# Patient Record
Sex: Female | Born: 1985 | Race: Black or African American | Hispanic: No | Marital: Married | Smoking: Current every day smoker
Health system: Southern US, Community
[De-identification: ages and names within clinical notes are randomized; demographics above are authoritative.]

---

## 2021-01-13 ENCOUNTER — Encounter: Payer: Self-pay | Admitting: Emergency Medicine

## 2021-01-13 ENCOUNTER — Other Ambulatory Visit: Payer: Self-pay

## 2021-01-13 ENCOUNTER — Emergency Department
Admission: EM | Admit: 2021-01-13 | Discharge: 2021-01-14 | Disposition: A | Discharge status: Home / Self Care | Payer: Self-pay | Attending: Emergency Medicine | Admitting: Emergency Medicine

## 2021-01-13 DIAGNOSIS — Z20822 Contact with and (suspected) exposure to covid-19: Secondary | ICD-10-CM | POA: Insufficient documentation

## 2021-01-13 DIAGNOSIS — F23 Brief psychotic disorder: Secondary | ICD-10-CM

## 2021-01-13 DIAGNOSIS — F1210 Cannabis abuse, uncomplicated: Secondary | ICD-10-CM

## 2021-01-13 DIAGNOSIS — F99 Mental disorder, not otherwise specified: Secondary | ICD-10-CM

## 2021-01-13 DIAGNOSIS — F17200 Nicotine dependence, unspecified, uncomplicated: Secondary | ICD-10-CM | POA: Insufficient documentation

## 2021-01-13 LAB — RESP PANEL BY RT-PCR (FLU A&B, COVID) ARPGX2
Influenza A by PCR: NEGATIVE
Influenza B by PCR: NEGATIVE
SARS Coronavirus 2 by RT PCR: NEGATIVE

## 2021-01-13 LAB — CBC
HCT: 35.3 % — ABNORMAL LOW (ref 36.0–46.0)
Hemoglobin: 11.9 g/dL — ABNORMAL LOW (ref 12.0–15.0)
MCH: 30.9 pg (ref 26.0–34.0)
MCHC: 33.7 g/dL (ref 30.0–36.0)
MCV: 91.7 fL (ref 80.0–100.0)
Platelets: 241 10*3/uL (ref 150–400)
RBC: 3.85 MIL/uL — ABNORMAL LOW (ref 3.87–5.11)
RDW: 13.7 % (ref 11.5–15.5)
WBC: 8.9 10*3/uL (ref 4.0–10.5)
nRBC: 0 % (ref 0.0–0.2)

## 2021-01-13 LAB — COMPREHENSIVE METABOLIC PANEL
ALT: 31 U/L (ref 0–44)
AST: 53 U/L — ABNORMAL HIGH (ref 15–41)
Albumin: 4.3 g/dL (ref 3.5–5.0)
Alkaline Phosphatase: 43 U/L (ref 38–126)
Anion gap: 14 (ref 5–15)
BUN: 21 mg/dL — ABNORMAL HIGH (ref 6–20)
CO2: 18 mmol/L — ABNORMAL LOW (ref 22–32)
Calcium: 9.4 mg/dL (ref 8.9–10.3)
Chloride: 105 mmol/L (ref 98–111)
Creatinine, Ser: 1.13 mg/dL — ABNORMAL HIGH (ref 0.44–1.00)
GFR, Estimated: >60 mL/min (ref >60)
Glucose, Bld: 91 mg/dL (ref 70–99)
Potassium: 3.2 mmol/L — ABNORMAL LOW (ref 3.5–5.1)
Sodium: 137 mmol/L (ref 135–145)
Total Bilirubin: 0.9 mg/dL (ref 0.3–1.2)
Total Protein: 7.9 g/dL (ref 6.5–8.1)

## 2021-01-13 LAB — SALICYLATE LEVEL: Salicylate Lvl: <7 mg/dL — ABNORMAL LOW (ref 7.0–30.0)

## 2021-01-13 LAB — ACETAMINOPHEN LEVEL: Acetaminophen (Tylenol), Serum: <10 ug/mL — ABNORMAL LOW (ref 10–30)

## 2021-01-13 LAB — ETHANOL: Alcohol, Ethyl (B): <10 mg/dL (ref <10)

## 2021-01-13 MED ORDER — LORAZEPAM 1 MG PO TABS
1.0000 mg | ORAL_TABLET | ORAL | Status: AC
  Administered 2021-01-13: 1 mg via ORAL
  Filled 2021-01-13: qty 1

## 2021-01-13 MED ORDER — LORAZEPAM 2 MG/ML IJ SOLN
2.0000 mg | Freq: Once | INTRAMUSCULAR | Status: AC
  Administered 2021-01-13: 2 mg via INTRAMUSCULAR
  Filled 2021-01-13: qty 1

## 2021-01-13 MED ORDER — NICOTINE 21 MG/24HR TD PT24: 21.0000 mg | MEDICATED_PATCH | Freq: Once | TRANSDERMAL | Status: DC

## 2021-01-13 MED ORDER — ZIPRASIDONE MESYLATE 20 MG IM SOLR
20.0000 mg | Freq: Once | INTRAMUSCULAR | Status: AC
  Administered 2021-01-13: 20 mg via INTRAMUSCULAR

## 2021-01-13 NOTE — ED Triage Notes (Signed)
Pt presents via acems with c/o bizarre behavior. Manager of apartment complex reports patient came into office this morning saying bizarre things with flight of ideas. Pt unable to keep single strain of thoughts. Pt denies SI/HI, AH/VH to this RN. Patient states working at [employer] has drove her "literally crazy". Pt with flight of ideas and not making much sense during triage. Pt states "I smoke weed and cigarettes, nothing else". Pt cooperative in dressing out with this RN and Caitlyn, NT. Belongings include: 1 black apron, 2 black shoes, 1 black jacket, 1 green shirt, 1 brown pants

## 2021-01-13 NOTE — ED Notes (Signed)
When this RN attempted to move patient to BMU, patient became irrate and began barking towards IT consultant in quad. Pt would not cooperate with move and was refusing to move. Pt unable to be easily directed at this time. Pt not violent towards this RN, only Psychologist, educational. IM medications ordered and administered as ordered. Pt took IM medications voluntarily by this RN and Joellen Jersey, Therapist, sports. No manual hold required. Pt calm and in room after administration. Pt given meal tray and sprite to drink at this time. BMU RN Joy informed that patient would not be moving at this time due to incident. Joy RN voiced understanding and stated RN could call after shift change if patient behavior improved.

## 2021-01-13 NOTE — ED Notes (Signed)
Patient is resting comfortably in bed with eyes closed

## 2021-01-13 NOTE — ED Notes (Signed)
Pt respirations remain even and unlabored at this time.
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
## 2021-01-13 NOTE — ED Notes (Signed)
IVC, pending BMU admit

## 2021-01-13 NOTE — BH Assessment (Signed)
Patient is to be admitted to [facility] by Dr. Weber Cooks.  Attending Physician will be Dr. Domingo Cocking.   Patient has been assigned to room 306, by [facility] Charge Nurse [place]  ER staff is aware of the admission:  Nithcia, ER Secretary    Dr. Archie Balboa, ER MD   Deneise Lever, Patient's Nurse   Carver Fila., Patient Access.

## 2021-01-13 NOTE — ED Notes (Signed)
Pt refusing to provide urine sample at this time. Pt pouring water in urine sample cup. Pt still with loose train of thought. Pt very paranoid stating to this RN "Why you standing like you gonna body slam me on this bed". Pt also stating at this time "I didn't know they were putting crack in newports now". Pt unable to be redirected, but patient not aggressive or violent. Pt returning to bed to lay down at this time.
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
## 2021-01-13 NOTE — ED Provider Notes (Signed)
[facility] Emergency Department Provider Note   ____________________________________________   I have reviewed the triage vital signs and the nursing notes.   HISTORY  Chief Complaint Psychiatric Evaluation   History limited by and level 5 caveat due to: Altered Mental Status/Psychosis   HPI Melanie Murray is a 35 y.o. female who presents to the emergency department today via EMS after being found to be saying odd things at her apartment complex. The patient herself cannot give a good history as to what happened today. She primarily talks about Public house manager and how she was there for so long she feels like they are always watching her. The patient also states she feels she needs to detox from cigarettes and marijuana.    Records reviewed. Per medical record review no pertinent history.   History reviewed. No pertinent past medical history.  There are no problems to display for this patient.   History reviewed. No pertinent surgical history.  Prior to Admission medications   Not on File    Allergies Patient has no known allergies.  History reviewed. No pertinent family history.  Social History Social History   Tobacco Use  . Smoking status: Current Every Day Smoker  . Smokeless tobacco: Never Used    Review of Systems Unable to obtain reliable ROS secondary to AMS.  ____________________________________________   PHYSICAL EXAM:  VITAL SIGNS: ED Triage Vitals  Enc Vitals Group     BP 01/13/21 1045 119/69     Pulse Rate 01/13/21 1045 96     Resp 01/13/21 1045 18     Temp 01/13/21 1045 98.5 F (36.9 C)     Temp Source 01/13/21 1045 Oral     SpO2 01/13/21 1045 99 %     Weight 01/13/21 1043 140 lb (63.5 kg)     Height 01/13/21 1043 5\' 5"  (1.651 m)     Head Circumference --      Peak Flow --      Pain Score 01/13/21 1043 0   Constitutional: Alert and oriented.  Eyes: Conjunctivae are normal.  ENT      Head: Normocephalic and  atraumatic.      Nose: No congestion/rhinnorhea.      Mouth/Throat: Mucous membranes are moist.      Neck: No stridor. Hematological/Lymphatic/Immunilogical: No cervical lymphadenopathy. Cardiovascular: Normal rate, regular rhythm.  No murmurs, rubs, or gallops.  Respiratory: Normal respiratory effort without tachypnea nor retractions. Breath sounds are clear and equal bilaterally. No wheezes/rales/rhonchi. Gastrointestinal: Soft and non tender. No rebound. No guarding.  Genitourinary: Deferred Musculoskeletal: Normal range of motion in all extremities. No lower extremity edema. Neurologic:  Pressured speech. No gross focal neurologic deficits are appreciated.  Skin:  Skin is warm, dry and intact. No rash noted. Psychiatric: Paranoia. Delusions. Pressured speech.   ____________________________________________    LABS (pertinent positives/negatives)  Salicylate, Tylenol, Ethanol below threshold CBC wbc 8.9, hgb 11.9, plt 241 CMP na 137, k 3.2, glu 91, cr 1.13  ____________________________________________   EKG  None  ____________________________________________    RADIOLOGY  None  ____________________________________________   PROCEDURES  Procedures  ____________________________________________   INITIAL IMPRESSION / ASSESSMENT AND PLAN / ED COURSE  Pertinent labs & imaging results that were available during my care of the patient were reviewed by me and considered in my medical decision making (see chart for details).   Patient presented to the emergency department today because of concern for abnormal behavior. On exam patient appears psychotic. Will have psychiatry evaluate.  The patient has been placed in psychiatric observation due to the need to provide a safe environment for the patient while obtaining psychiatric consultation and evaluation, as well as ongoing medical and medication management to treat the patient's condition.  The patient has not been  placed under full IVC at this time.   ____________________________________________   FINAL CLINICAL IMPRESSION(S) / ED DIAGNOSES  Psychosis  Note: This dictation was prepared with Dragon dictation. Any transcriptional errors that result from this process are unintentional     Nance Pear, MD 01/13/21 1347

## 2021-01-14 ENCOUNTER — Encounter: Payer: Self-pay | Admitting: Psychiatry

## 2021-01-14 ENCOUNTER — Other Ambulatory Visit: Payer: Self-pay

## 2021-01-14 ENCOUNTER — Inpatient Hospital Stay
Admission: RE | Admit: 2021-01-14 | Discharge: 2021-01-18 | DRG: 885 | Disposition: A | Discharge status: Home / Self Care | Payer: 59 | Source: Intra-hospital | Attending: Behavioral Health | Admitting: Behavioral Health

## 2021-01-14 DIAGNOSIS — F1210 Cannabis abuse, uncomplicated: Secondary | ICD-10-CM | POA: Diagnosis present

## 2021-01-14 DIAGNOSIS — G4700 Insomnia, unspecified: Secondary | ICD-10-CM | POA: Diagnosis present

## 2021-01-14 DIAGNOSIS — F23 Brief psychotic disorder: Principal | ICD-10-CM | POA: Diagnosis present

## 2021-01-14 DIAGNOSIS — F17210 Nicotine dependence, cigarettes, uncomplicated: Secondary | ICD-10-CM | POA: Diagnosis present

## 2021-01-14 DIAGNOSIS — F29 Unspecified psychosis not due to a substance or known physiological condition: Secondary | ICD-10-CM

## 2021-01-14 DIAGNOSIS — Z20822 Contact with and (suspected) exposure to covid-19: Secondary | ICD-10-CM | POA: Diagnosis present

## 2021-01-14 MED ORDER — RISPERIDONE 1 MG PO TBDP: 2.0000 mg | ORAL_TABLET | Freq: Every day | ORAL | Status: DC

## 2021-01-14 MED ORDER — OLANZAPINE 5 MG PO TBDP
10.0000 mg | ORAL_TABLET | Freq: Every day | ORAL | Status: DC
  Filled 2021-01-14: qty 2

## 2021-01-14 MED ORDER — LORAZEPAM 1 MG PO TABS
1.0000 mg | ORAL_TABLET | Freq: Four times a day (QID) | ORAL | Status: DC | PRN
  Administered 2021-01-14 – 2021-01-15 (×2): 1 mg via ORAL
  Filled 2021-01-14 (×2): qty 1

## 2021-01-14 MED ORDER — ACETAMINOPHEN 325 MG PO TABS
650.0000 mg | ORAL_TABLET | Freq: Four times a day (QID) | ORAL | Status: DC | PRN
  Administered 2021-01-17 – 2021-01-18 (×2): 650 mg via ORAL
  Filled 2021-01-14 (×2): qty 2

## 2021-01-14 MED ORDER — LORAZEPAM 2 MG/ML IJ SOLN
2.0000 mg | Freq: Once | INTRAMUSCULAR | Status: AC
  Administered 2021-01-14: 2 mg via INTRAMUSCULAR
  Filled 2021-01-14: qty 1

## 2021-01-14 MED ORDER — MAGNESIUM HYDROXIDE 400 MG/5ML PO SUSP: 30.0000 mL | Freq: Every day | ORAL | Status: DC | PRN

## 2021-01-14 MED ORDER — ZIPRASIDONE MESYLATE 20 MG IM SOLR
20.0000 mg | Freq: Four times a day (QID) | INTRAMUSCULAR | Status: DC | PRN
  Administered 2021-01-15: 20 mg via INTRAMUSCULAR
  Filled 2021-01-14: qty 20

## 2021-01-14 MED ORDER — ALUM & MAG HYDROXIDE-SIMETH 200-200-20 MG/5ML PO SUSP: 30.0000 mL | ORAL | Status: DC | PRN

## 2021-01-14 MED ORDER — NICOTINE 21 MG/24HR TD PT24
21.0000 mg | MEDICATED_PATCH | Freq: Once | TRANSDERMAL | Status: AC
  Administered 2021-01-14: 21 mg via TRANSDERMAL
  Filled 2021-01-14: qty 1

## 2021-01-14 NOTE — ED Notes (Signed)
REPORT GIVEN TO RECEIVING NURSE, AWAITING TRANSFER TO [facility] [id number]

## 2021-01-14 NOTE — H&P (Addendum)
Psychiatric Admission Assessment Adult  Patient Identification: Melanie Murray MRN:  [id number] Date of Evaluation:  01/14/2021 Chief Complaint:  Psychosis ([place]) [F29] Principal Diagnosis: Psychosis ([place]) Diagnosis:  Principal Problem:   Psychosis ([place]) Active Problems:   Cannabis abuse  CC "Kristopher Oppenheim did this."  History of Present Illness: 35 year old female who presented to the emergency room for altered mental status and bizarre behavior. On examination today she is restless, pacing, and labile. She has intermittent inappropriate laughter. Her responses to questions are circumstantial, and at time completely unrelated to what I have just asked. Her thoughts are grossly disorganized, and she appears to be responding to internal stimuli. From what I can gather from interview, she feels that she has been overworked at her job at [employer] for the last 4 years. She feels she is giving a lot to make sure her three children (ages 5, 18, and 17) have everything they want. She also felt she was doing a lot for her husband, but he recently left her for another woman. She also notes she spends a lot of money on her sister and family, and mentions purchasing a car for her sister. However, she feels that she is just being used by the people in her life, and she cannot get any help in return. She also requests carrots multiple times through the exam stating they help her with smoking cessation. She endorses poor sleep, poor appetite, fatigue, and anhedonia. She also exhibits some symptoms of mania including pressured speech, labile mood, and psychosis. She denies suicidal ideations, homicidal ideations, visual hallucinations, or auditory hallucinations. However, she does appear internally preoccupied on exam.   I explained that I would be starting Zyprexa tonight to assist with disorganized thoughts, mood, insomnia, and poor appetite. Patient was agreeable with this plan.    Associated  Signs/Symptoms: Depression Symptoms:  depressed mood, insomnia, fatigue, difficulty concentrating, impaired memory, weight loss, Duration of Depression Symptoms: Less than two weeks  (Hypo) Manic Symptoms:  Distractibility, Financial Extravagance, Impulsivity, Labiality of Mood, Anxiety Symptoms:  Excessive Worry, Psychotic Symptoms:  Paranoia, PTSD Symptoms: Negative Total Time spent with patient: 1 hour  Past Psychiatric History: Patient denies any previous psychiatric treatment outpatient or inpatient. No previous medication trials, no previous suicide attempts.   Is the patient at risk to self? Yes.    Has the patient been a risk to self in the past 6 months? No.  Has the patient been a risk to self within the distant past? No.  Is the patient a risk to others? No.  Has the patient been a risk to others in the past 6 months? No.  Has the patient been a risk to others within the distant past? No.   Prior Inpatient Therapy:   Prior Outpatient Therapy:    Alcohol Screening: 1. How often do you have a drink containing alcohol?: 2 to 4 times a month 2. How many drinks containing alcohol do you have on a typical day when you are drinking?: 1 or 2 3. How often do you have six or more drinks on one occasion?: Never AUDIT-C Score: 2 4. How often during the last year have you found that you were not able to stop drinking once you had started?: Never 5. How often during the last year have you failed to do what was normally expected from you because of drinking?: Never 6. How often during the last year have you needed a first drink in the morning to get yourself going after  a heavy drinking session?: Never 7. How often during the last year have you had a feeling of guilt of remorse after drinking?: Never 8. How often during the last year have you been unable to remember what happened the night before because you had been drinking?: Never 9. Have you or someone else been injured as a  result of your drinking?: No 10. Has a relative or friend or a doctor or another health worker been concerned about your drinking or suggested you cut down?: No Alcohol Use Disorder Identification Test Final Score (AUDIT): 2 Substance Abuse History in the last 12 months:  Yes.   Consequences of Substance Abuse: Worsening mental health Previous Psychotropic Medications: No  Psychological Evaluations: Yes  Past Medical History: History reviewed. No pertinent past medical history. History reviewed. No pertinent surgical history. Family History: History reviewed. No pertinent family history. Family Psychiatric  History: States her mother struggles with depression and anxiety, but does not believe she ever got medication for these Tobacco Screening:   Social History:  Social History   Substance and Sexual Activity  Alcohol Use Yes  . Alcohol/week: 1.0 standard drink  . Types: 1 Standard drinks or equivalent per week     Social History   Substance and Sexual Activity  Drug Use Not on file    Additional Social History:                           Allergies:  No Known Allergies Lab Results:  Results for orders placed or performed during the hospital encounter of 01/13/21 (from the past 48 hour(s))  Comprehensive metabolic panel     Status: Abnormal   Collection Time: 01/13/21 10:25 AM  Result Value Ref Range   Sodium 137 135 - 145 mmol/L   Potassium 3.2 (L) 3.5 - 5.1 mmol/L   Chloride 105 98 - 111 mmol/L   CO2 18 (L) 22 - 32 mmol/L   Glucose, Bld 91 70 - 99 mg/dL    Comment: Glucose reference range applies only to samples taken after fasting for at least 8 hours.   BUN 21 (H) 6 - 20 mg/dL   Creatinine, Ser 1.13 (H) 0.44 - 1.00 mg/dL   Calcium 9.4 8.9 - 10.3 mg/dL   Total Protein 7.9 6.5 - 8.1 g/dL   Albumin 4.3 3.5 - 5.0 g/dL   AST 53 (H) 15 - 41 U/L   ALT 31 0 - 44 U/L   Alkaline Phosphatase 43 38 - 126 U/L   Total Bilirubin 0.9 0.3 - 1.2 mg/dL   GFR, Estimated >60  >60 mL/min    Comment: (NOTE) Calculated using the CKD-EPI Creatinine Equation (2021)    Anion gap 14 5 - 15    Comment: Performed at [facility], [address] [place], [place] [address]  Ethanol     Status: None   Collection Time: 01/13/21 10:25 AM  Result Value Ref Range   Alcohol, Ethyl (B) <10 <10 mg/dL    Comment: (NOTE) Lowest detectable limit for serum alcohol is 10 mg/dL.  For medical purposes only. Performed at [facility], [address] [place], [place] [address]   Salicylate level     Status: Abnormal   Collection Time: 01/13/21 10:25 AM  Result Value Ref Range   Salicylate Lvl <1.7 (L) 7.0 - 30.0 mg/dL    Comment: Performed at [facility], [address], [place],  [address]  Acetaminophen level  Status: Abnormal   Collection Time: 01/13/21 10:25 AM  Result Value Ref Range   Acetaminophen (Tylenol), Serum <10 (L) 10 - 30 ug/mL    Comment: (NOTE) Therapeutic concentrations vary significantly. A range of 10-30 ug/mL  may be an effective concentration for many patients. However, some  are best treated at concentrations outside of this range. Acetaminophen concentrations >150 ug/mL at 4 hours after ingestion  and >50 ug/mL at 12 hours after ingestion are often associated with  toxic reactions.  Performed at [facility], [address] [place], [place] [address]   cbc     Status: Abnormal   Collection Time: 01/13/21 10:25 AM  Result Value Ref Range   WBC 8.9 4.0 - 10.5 K/uL   RBC 3.85 (L) 3.87 - 5.11 MIL/uL   Hemoglobin 11.9 (L) 12.0 - 15.0 g/dL   HCT 35.3 (L) 36.0 - 46.0 %   MCV 91.7 80.0 - 100.0 fL   MCH 30.9 26.0 - 34.0 pg   MCHC 33.7 30.0 - 36.0 g/dL   RDW 13.7 11.5 - 15.5 %   Platelets 241 150 - 400 K/uL   nRBC 0.0 0.0 - 0.2 %    Comment: Performed at [facility], [address] [place], [place] [address]  Resp Panel by RT-PCR (Flu A&B, Covid) Nasopharyngeal Swab     Status: None    Collection Time: 01/13/21  1:53 PM   Specimen: Nasopharyngeal Swab; Nasopharyngeal(NP) swabs in vial transport medium  Result Value Ref Range   SARS Coronavirus 2 by RT PCR NEGATIVE NEGATIVE    Comment: (NOTE) SARS-CoV-2 target nucleic acids are NOT DETECTED.  The SARS-CoV-2 RNA is generally detectable in upper respiratory specimens during the acute phase of infection. The lowest concentration of SARS-CoV-2 viral copies this assay can detect is 138 copies/mL. A negative result does not preclude SARS-Cov-2 infection and should not be used as the sole basis for treatment or other patient management decisions. A negative result may occur with  improper specimen collection/handling, submission of specimen other than nasopharyngeal swab, presence of viral mutation(s) within the areas targeted by this assay, and inadequate number of viral copies(<138 copies/mL). A negative result must be combined with clinical observations, patient history, and epidemiological information. The expected result is Negative.  Fact Sheet for Patients:  EntrepreneurPulse.com.au  Fact Sheet for Healthcare Providers:  IncredibleEmployment.be  This test is no t yet approved or cleared by the [place] FDA and  has been authorized for detection and/or diagnosis of SARS-CoV-2 by FDA under an Emergency Use Authorization (EUA). This EUA will remain  in effect (meaning this test can be used) for the duration of the COVID-19 declaration under Section 564(b)(1) of the Act, 21 U.S.C.section 360bbb-3(b)(1), unless the authorization is terminated  or revoked sooner.       Influenza A by PCR NEGATIVE NEGATIVE   Influenza B by PCR NEGATIVE NEGATIVE    Comment: (NOTE) The Xpert Xpress SARS-CoV-2/FLU/RSV plus assay is intended as an aid in the diagnosis of influenza from Nasopharyngeal swab specimens and should not be used as a sole basis for treatment. Nasal washings  and aspirates are unacceptable for Xpert Xpress SARS-CoV-2/FLU/RSV testing.  Fact Sheet for Patients: EntrepreneurPulse.com.au  Fact Sheet for Healthcare Providers: IncredibleEmployment.be  This test is not yet approved or cleared by the [place] FDA and has been authorized for detection and/or diagnosis of SARS-CoV-2 by FDA under an Emergency Use Authorization (EUA). This EUA will remain in effect (meaning this test can  be used) for the duration of the COVID-19 declaration under Section 564(b)(1) of the Act, 21 U.S.C. section 360bbb-3(b)(1), unless the authorization is terminated or revoked.  Performed at [facility], [address] [place], [place] [address]     Blood Alcohol level:  Lab Results  Component Value Date   [facility] <10 35/00/9381    Metabolic Disorder Labs:  No results found for: HGBA1C, MPG No results found for: PROLACTIN No results found for: CHOL, TRIG, HDL, CHOLHDL, VLDL, LDLCALC  Current Medications: Current Facility-Administered Medications  Medication Dose Route Frequency Provider Last Rate Last Admin  . acetaminophen (TYLENOL) tablet 650 mg  650 mg Oral Q6H PRN Clapacs, John T, MD      . alum & mag hydroxide-simeth (MAALOX/MYLANTA) 200-200-20 MG/5ML suspension 30 mL  30 mL Oral Q4H PRN Clapacs, John T, MD      . LORazepam (ATIVAN) tablet 1 mg  1 mg Oral Q6H PRN Clapacs, John T, MD      . magnesium hydroxide (MILK OF MAGNESIA) suspension 30 mL  30 mL Oral Daily PRN Clapacs, John T, MD      . nicotine (NICODERM CQ - dosed in mg/24 hours) patch 21 mg  21 mg Transdermal Once Clapacs, John T, MD      . OLANZapine zydis (ZYPREXA) disintegrating tablet 10 mg  10 mg Oral QHS Salley Scarlet, MD      . ziprasidone (GEODON) injection 20 mg  20 mg Intramuscular Q6H PRN Salley Scarlet, MD       PTA Medications: No medications prior to admission.    Musculoskeletal: Strength & Muscle Tone: within normal  limits Gait & Station: normal Patient leans: N/A            Psychiatric Specialty Exam:  Presentation  General Appearance: Disheveled  Eye Contact:Fair  Speech:Pressured  Speech Volume:Normal  Handedness:Right   Mood and Affect  Mood:Labile  Affect:Labile; Tearful   Thought Process  Thought Processes:Disorganized  Duration of Psychotic Symptoms: Less than six months  Past Diagnosis of Schizophrenia or Psychoactive disorder: No  Descriptions of Associations:Circumstantial  Orientation:Full (Time, Place and Person)  Thought Content:Perseveration; Scattered  Hallucinations:Hallucinations: -- (Patient denies, but appears internally preoccupied)  Ideas of Reference:None  Suicidal Thoughts:Suicidal Thoughts: Yes, Passive  Homicidal Thoughts:Homicidal Thoughts: No   Sensorium  Memory:Immediate Poor; Recent Fair; Remote [place]   Executive Functions  Concentration:Poor  Attention Span:Poor  [place]   Psychomotor Activity  Psychomotor Activity:Psychomotor Activity: Increased   Assets  Assets:Desire for Improvement; Housing; Social Support; Talents/Skills; Vocational/Educational   Sleep  Sleep:Sleep: Poor    Physical Exam: Physical Exam Vitals and nursing note reviewed.  Constitutional:      Appearance: Normal appearance.  HENT:     Head: Normocephalic and atraumatic.     Right Ear: External ear normal.     Left Ear: External ear normal.     Nose: Nose normal.     Mouth/Throat:     Mouth: Mucous membranes are moist.     Pharynx: Oropharynx is clear.  Eyes:     Extraocular Movements: Extraocular movements intact.     Conjunctiva/sclera: Conjunctivae normal.     Pupils: Pupils are equal, round, and reactive to light.  Cardiovascular:     Rate and Rhythm: Normal rate.     Pulses: Normal pulses.  Pulmonary:     Effort: Pulmonary effort is normal.      Breath sounds: Normal breath  sounds.  Abdominal:     General: Abdomen is flat.     Palpations: Abdomen is soft.  Musculoskeletal:        General: No swelling. Normal range of motion.     Cervical back: Normal range of motion and neck supple.  Skin:    General: Skin is warm and dry.  Neurological:     General: No focal deficit present.     Mental Status: She is alert.     Cranial Nerves: No cranial nerve deficit.  Psychiatric:        Attention and Perception: She is inattentive.        Mood and Affect: Affect is labile and tearful.        Speech: Speech is rapid and pressured.        Behavior: Behavior is agitated.        Thought Content: Thought content is paranoid.        Cognition and Memory: Cognition is impaired. Memory is impaired.        Judgment: Judgment is impulsive.    Review of Systems  Constitutional: Positive for malaise/fatigue and weight loss.  HENT: Negative.   Eyes: Negative.   Respiratory: Negative.   Cardiovascular: Negative.   Gastrointestinal: Negative.   Genitourinary: Negative.   Musculoskeletal: Positive for back pain and myalgias.  Skin: Negative.   Neurological: Negative.   Endo/Heme/Allergies: Negative.   Psychiatric/Behavioral: Positive for substance abuse. The patient is nervous/anxious and has insomnia.    Blood pressure (!) 121/93, pulse 80, temperature 98 F (36.7 C), temperature source Oral, resp. rate 16, height 5\' 5"  (1.651 m), weight 73.9 kg, SpO2 99 %. Body mass index is 27.12 kg/m.  Treatment Plan Summary: Daily contact with patient to assess and evaluate symptoms and progress in treatment and Medication management 35 year old female presenting with altered mental status and psychosis. Differential includes bipolar disorder with psychotic features, mixed episode of depression with psychotic features, brief psychotic disorder, or substance induced psychosis. Will start Zyprexa 10 mg QHS.   Observation Level/Precautions:  15 minute  checks  Laboratory:  Lipid panel, hemoglobin A1c, TSH, RPR, UDS  Psychotherapy:    Medications:    Consultations:    Discharge Concerns:    Estimated LOS:  Other:     Physician Treatment Plan for Primary Diagnosis: Psychosis ([place]) Long Term Goal(s): Improvement in symptoms so as ready for discharge  Short Term Goals: Ability to identify changes in lifestyle to reduce recurrence of condition will improve, Ability to verbalize feelings will improve, Ability to disclose and discuss suicidal ideas, Ability to demonstrate self-control will improve, Ability to identify and develop effective coping behaviors will improve, Ability to maintain clinical measurements within normal limits will improve, Compliance with prescribed medications will improve and Ability to identify triggers associated with substance abuse/mental health issues will improve  Physician Treatment Plan for Secondary Diagnosis: Principal Problem:   Psychosis ([place]) Active Problems:   Cannabis abuse  Long Term Goal(s): Improvement in symptoms so as ready for discharge  Short Term Goals: Ability to identify changes in lifestyle to reduce recurrence of condition will improve, Ability to verbalize feelings will improve, Ability to disclose and discuss suicidal ideas, Ability to demonstrate self-control will improve, Ability to identify and develop effective coping behaviors will improve, Ability to maintain clinical measurements within normal limits will improve, Compliance with prescribed medications will improve and Ability to identify triggers associated with substance abuse/mental health issues will improve  I certify that inpatient services furnished can  reasonably be expected to improve the patient's condition.    Salley Scarlet, MD 4/15/20223:48 PM

## 2021-01-14 NOTE — ED Notes (Addendum)
Pt's husband in pt room for 15 minute visit. Visit being monitored by this tech.

## 2021-01-14 NOTE — ED Provider Notes (Signed)
Emergency Medicine Observation Re-evaluation Note  Korena Nass is a 35 y.o. female, seen on rounds today.  Pt initially presented to the ED for complaints of Psychiatric Evaluation Currently, the patient is agitated, pacing around her room, slapping her lateral thighs repeatedly.  Physical Exam  BP 107/63 (BP Location: Left Arm)   Pulse 71   Temp 98.7 F (37.1 C) (Oral)   Resp 16   Ht 5\' 5"  (1.651 m)   Wt 63.5 kg   SpO2 100%   BMI 23.30 kg/m  Physical Exam Gen: No acute distress  Resp: Normal rise and fall of chest Neuro: Moving all four extremities Psych: Resting currently, calm and cooperative when awake    ED Course / MDM  EKG:   I have reviewed the labs performed to date as well as medications administered while in observation.  Recent changes in the last 24 hours include increasing agitation overnight requiring IM Ativan.  Plan  Current plan is for psychiatric inpatient treatment. Patient is under full IVC at this time.   Zale Marcotte, Delice Bison, DO 01/14/21 [phone]

## 2021-01-14 NOTE — ED Notes (Signed)
Patient administered IM Ativan 2mg  for anxiety. She also stated she had pain in her right upper arm. Patient was still experiencing anxiety. She spoke about her husband and stated that he once left the marriage which caused a lot of depression. She again asked about her husband, children and mother. Patient tolerated medication well. No agitation noted or observed. No redirection needed. Request for ice and a blanket. Patient has been in and out of sleep. Will continue to monitor.

## 2021-01-14 NOTE — Progress Notes (Signed)
Patient is pleasant and cooperative with assessment. However, her thoughts are disorganized and she is responding to internal stimuli. Speech is tangential and she sometimes appears to be speaking to herself. Patient denies SI, HI, and AVH. Patient is stressed about her job at [employer] and needs to support her three children. She also feels like she needs to work on her anger and states she can have outbursts. Patient is also stressed because she is having problems with her husband.   While on the unit, patient is restless and paces the unit. Her mood is labile and she is irritable at times. Patient endorsed anxiety and was visibly shaking. Patient was given PRN Ativan.  Patient remains safe on the unit at this time and q15 min safety checks are maintained.

## 2021-01-14 NOTE — ED Notes (Signed)
Patient received phone call from Husband.

## 2021-01-14 NOTE — Tx Team (Signed)
Initial Treatment Plan 01/14/2021 5:50 PM [name] DHR:[id number]   PATIENT STRESSORS: Marital or family conflict Occupational concerns   PATIENT STRENGTHS: Capable of independent living   PATIENT IDENTIFIED PROBLEMS: Being overworked at her job  Marital conflict with husband                   DISCHARGE CRITERIA:  Improved stabilization in mood, thinking, and/or behavior  PRELIMINARY DISCHARGE PLAN: Return to previous living arrangement  PATIENT/FAMILY INVOLVEMENT: This treatment plan has been presented to and reviewed with the patient, Melanie Murray.  The patient has been given the opportunity to ask questions and make suggestions.  Sunday Spillers, RN 01/14/2021, 5:50 PM

## 2021-01-14 NOTE — BHH Suicide Risk Assessment (Signed)
[facility] Admission Suicide Risk Assessment   Nursing information obtained from:  Patient Demographic factors:  Divorced or widowed Current Mental Status:  NA Loss Factors:  Loss of significant relationship Historical Factors:  NA Risk Reduction Factors:  Employed  Total Time spent with patient: 1 hour Principal Problem: Psychosis ([place]) Diagnosis:  Principal Problem:   Psychosis ([place]) Active Problems:   Cannabis abuse  Subjective Data: 35 year old female who presented to the emergency room for altered mental status and bizarre behavior. On examination today she is restless, pacing, and labile. She has intermittent inappropriate laughter. Her responses to questions are circumstantial, and at time completely unrelated to what I have just asked. Her thoughts are grossly disorganized, and she appears to be responding to internal stimuli. From what I can gather from interview, she feels that she has been overworked at her job at [employer] for the last 4 years. She feels she is giving a lot to make sure her three children (ages 61, 45, and 58) have everything they want. She also felt she was doing a lot for her husband, but he recently left her for another woman. She also notes she spends a lot of money on her sister and family, and mentions purchasing a car for her sister. However, she feels that she is just being used by the people in her life, and she cannot get any help in return. She also requests carrots multiple times through the exam stating they help her with smoking cessation. She endorses poor sleep, poor appetite, fatigue, and anhedonia. She also exhibits some symptoms of mania including pressured speech, labile mood, and psychosis. She denies suicidal ideations, homicidal ideations, visual hallucinations, or auditory hallucinations. However, she does appear internally preoccupied on exam.   I explained that I would be starting Zyprexa tonight to assist with disorganized thoughts, mood,  insomnia, and poor appetite. Patient was agreeable with this plan.   Continued Clinical Symptoms:  Alcohol Use Disorder Identification Test Final Score (AUDIT): 2 The "Alcohol Use Disorders Identification Test", Guidelines for Use in Primary Care, Second Edition.  World Pharmacologist [facility]). Score between 0-7:  no or low risk or alcohol related problems. Score between 8-15:  moderate risk of alcohol related problems. Score between 16-19:  high risk of alcohol related problems. Score 20 or above:  warrants further diagnostic evaluation for alcohol dependence and treatment.   CLINICAL FACTORS:   Severe Anxiety and/or Agitation Depression:   Comorbid alcohol abuse/dependence Impulsivity Insomnia Currently Psychotic Unstable or Poor Therapeutic Relationship Previous Psychiatric Diagnoses and Treatments   Musculoskeletal: Strength & Muscle Tone: within normal limits Gait & Station: normal Patient leans: N/A  Psychiatric Specialty Exam:  Presentation  General Appearance: Disheveled  Eye Contact:Fair  Speech:Pressured  Speech Volume:Normal  Handedness:Right   Mood and Affect  Mood:Labile  Affect:Labile; Tearful   Thought Process  Thought Processes:Disorganized  Descriptions of Associations:Circumstantial  Orientation:Full (Time, Place and Person)  Thought Content:Perseveration; Scattered  History of Schizophrenia/Schizoaffective disorder:No  Duration of Psychotic Symptoms:Less than six months  Hallucinations:Hallucinations: -- (Patient denies, but appears internally preoccupied)  Ideas of Reference:None  Suicidal Thoughts:Suicidal Thoughts: Yes, Passive  Homicidal Thoughts:Homicidal Thoughts: No   Sensorium  Memory:Immediate Poor; Recent Fair; Remote [place]   Executive Functions  Concentration:Poor  Attention Span:Poor  [place]   Psychomotor Activity   Psychomotor Activity:Psychomotor Activity: Increased   Assets  Assets:Desire for Improvement; Housing; Social Support; Talents/Skills; Vocational/Educational   Sleep  Sleep:Sleep: Poor  Physical Exam: Physical Exam ROS Blood pressure (!) 121/93, pulse 80, temperature 98 F (36.7 C), temperature source Oral, resp. rate 16, height 5\' 5"  (1.651 m), weight 73.9 kg, SpO2 99 %. Body mass index is 27.12 kg/m.   COGNITIVE FEATURES THAT CONTRIBUTE TO RISK:  Loss of executive function    SUICIDE RISK:   Mild:  Suicidal ideation of limited frequency, intensity, duration, and specificity.  There are no identifiable plans, no associated intent, mild dysphoria and related symptoms, good self-control (both objective and subjective assessment), few other risk factors, and identifiable protective factors, including available and accessible social support.  PLAN OF CARE: Continue inpatient admission, see H&P for details  I certify that inpatient services furnished can reasonably be expected to improve the patient's condition.   Salley Scarlet, MD 01/14/2021, 4:01 PM

## 2021-01-14 NOTE — ED Notes (Signed)
Patient up out of bed to bathroom, shower and oral hygiene supplies offered. Patient declined. And went back into room. Patient with disorganized thought process, " repeating out loud " I don't know If I missed my daughters graduation, acting stupid", then moves on to say harris teeter, I need time to get my thoughts together, facebook someone said my house was dirty I thought harris teeter was mad at me, and mumbling about other things, like mother trying to take her home, husband being afraid, patient unable to complete one thought process before starting another one" Patient easily redirectable, unable to orient to time and place. Patient denies hearing voices, Homicidal ideation and SI

## 2021-01-14 NOTE — ED Notes (Signed)
Patient resting comfortably in bed. Easily aroused. Patient communicated appropriately during rounding Presented calm and cooperative, however, she stated that she felt anxious and was concerned about her husband, children and mother. Alert and oriented X 3.  Patient was easily redirected. VS obtained and patient stated that she was exhausted and went back to sleep. Will continue to monitor.

## 2021-01-15 DIAGNOSIS — F29 Unspecified psychosis not due to a substance or known physiological condition: Secondary | ICD-10-CM | POA: Diagnosis not present

## 2021-01-15 LAB — LIPID PANEL
Cholesterol: 125 mg/dL (ref 0–200)
HDL: 47 mg/dL (ref >40)
LDL Cholesterol: 64 mg/dL (ref 0–99)
Total CHOL/HDL Ratio: 2.7 RATIO
Triglycerides: 69 mg/dL (ref <150)
VLDL: 14 mg/dL (ref 0–40)

## 2021-01-15 LAB — TSH: TSH: 0.988 u[IU]/mL (ref 0.350–4.500)

## 2021-01-15 MED ORDER — OLANZAPINE 5 MG PO TBDP
5.0000 mg | ORAL_TABLET | Freq: Every day | ORAL | Status: DC
  Administered 2021-01-15 – 2021-01-18 (×4): 5 mg via ORAL
  Filled 2021-01-15 (×4): qty 1

## 2021-01-15 MED ORDER — OLANZAPINE 5 MG PO TBDP
15.0000 mg | ORAL_TABLET | Freq: Every day | ORAL | Status: DC
  Administered 2021-01-15 – 2021-01-17 (×3): 15 mg via ORAL
  Filled 2021-01-15 (×3): qty 3

## 2021-01-15 MED ORDER — LORAZEPAM 2 MG/ML IJ SOLN: 2.0000 mg | Freq: Four times a day (QID) | INTRAMUSCULAR | Status: DC | PRN

## 2021-01-15 MED ORDER — NICOTINE 21 MG/24HR TD PT24
21.0000 mg | MEDICATED_PATCH | Freq: Every day | TRANSDERMAL | Status: DC
  Administered 2021-01-15 – 2021-01-18 (×4): 21 mg via TRANSDERMAL
  Filled 2021-01-15 (×4): qty 1

## 2021-01-15 MED ORDER — LORAZEPAM 2 MG PO TABS: 2.0000 mg | ORAL_TABLET | Freq: Four times a day (QID) | ORAL | Status: DC | PRN

## 2021-01-15 MED ORDER — LORAZEPAM 2 MG PO TABS
2.0000 mg | ORAL_TABLET | Freq: Four times a day (QID) | ORAL | Status: DC | PRN
  Administered 2021-01-16 – 2021-01-18 (×4): 2 mg via ORAL
  Filled 2021-01-15 (×4): qty 1

## 2021-01-15 NOTE — Plan of Care (Signed)
  Problem: Education: Goal: Knowledge of [facility] Education information/materials will improve Outcome: Progressing Goal: Emotional status will improve Outcome: Progressing Goal: Mental status will improve Outcome: Progressing Goal: Verbalization of understanding the information provided will improve Outcome: Progressing   Problem: Activity: Goal: Interest or engagement in activities will improve Outcome: Progressing Goal: Sleeping patterns will improve Outcome: Progressing   Problem: Coping: Goal: Ability to verbalize frustrations and anger appropriately will improve Outcome: Progressing Goal: Ability to demonstrate self-control will improve Outcome: Progressing   

## 2021-01-15 NOTE — Progress Notes (Signed)
[facility] MD Progress Note  01/15/2021 11:30 AM Melanie Murray  MRN:  [id number] Subjective: Patient seen chart reviewed.  Follow-up for this 35 year old woman without past psychiatric history who is presenting with acute psychosis.  Patient has been agitated most of the time she has been on the ward.  Nursing reports that she is pacing much of the time.  Hyperverbal.  Labile affect odd behavior.  Making noise in the hallway for no clear reason.  On interview the patient is unable to answer clear questions because of her pressured speech.  Talks about Suzie Portela talks about stress does not give me any more specific information about her life.  Demands to be discharged and when told that that would not be possible today abruptly ends conversation and goes off to pace again.  Notably she was able to stay seated for a while and did not seem to be restless in a manner typical of akathisia Principal Problem: Psychosis ([place]) Diagnosis: Principal Problem:   Psychosis ([place]) Active Problems:   Cannabis abuse  Total Time spent with patient: 30 minutes  Past Psychiatric History: Unknown past psychiatric history.  No report of any past psychotic disorder.  Patient has refused to consent to contact with family  Past Medical History: History reviewed. No pertinent past medical history. History reviewed. No pertinent surgical history. Family History: History reviewed. No pertinent family history. Family Psychiatric  History: None known Social History:  Social History   Substance and Sexual Activity  Alcohol Use Yes  . Alcohol/week: 1.0 standard drink  . Types: 1 Standard drinks or equivalent per week     Social History   Substance and Sexual Activity  Drug Use Not on file    Social History   Socioeconomic History  . Marital status: Divorced    Spouse name: Not on file  . Number of children: Not on file  . Years of education: Not on file  . Highest education level: Not on file  Occupational History  .  Not on file  Tobacco Use  . Smoking status: Current Every Day Smoker    Packs/day: 1.00    Types: Cigarettes  . Smokeless tobacco: Never Used  Substance and Sexual Activity  . Alcohol use: Yes    Alcohol/week: 1.0 standard drink    Types: 1 Standard drinks or equivalent per week  . Drug use: Not on file  . Sexual activity: Not on file  Other Topics Concern  . Not on file  Social History Narrative  . Not on file   Social Determinants of Health   Financial Resource Strain: Not on file  Food Insecurity: Not on file  Transportation Needs: Not on file  Physical Activity: Not on file  Stress: Not on file  Social Connections: Not on file   Additional Social History:                         Sleep: Poor  Appetite:  Poor  Current Medications: Current Facility-Administered Medications  Medication Dose Route Frequency Provider Last Rate Last Admin  . acetaminophen (TYLENOL) tablet 650 mg  650 mg Oral Q6H PRN Takeo Harts T, MD      . alum & mag hydroxide-simeth (MAALOX/MYLANTA) 200-200-20 MG/5ML suspension 30 mL  30 mL Oral Q4H PRN Jaison Petraglia T, MD      . LORazepam (ATIVAN) tablet 2 mg  2 mg Oral Q6H PRN Cheryn Lundquist, Madie Reno, MD       Or  .  LORazepam (ATIVAN) injection 2 mg  2 mg Intramuscular Q6H PRN Ophelia Sipe T, MD      . magnesium hydroxide (MILK OF MAGNESIA) suspension 30 mL  30 mL Oral Daily PRN Osvaldo Lamping T, MD      . nicotine (NICODERM CQ - dosed in mg/24 hours) patch 21 mg  21 mg Transdermal Once Astra Gregg T, MD   21 mg at 01/14/21 1727  . nicotine (NICODERM CQ - dosed in mg/24 hours) patch 21 mg  21 mg Transdermal Daily Salley Scarlet, MD   21 mg at 01/15/21 0843  . OLANZapine zydis (ZYPREXA) disintegrating tablet 15 mg  15 mg Oral QHS Hallie Ertl T, MD      . OLANZapine zydis (ZYPREXA) disintegrating tablet 5 mg  5 mg Oral Daily Noor Vidales T, MD      . ziprasidone (GEODON) injection 20 mg  20 mg Intramuscular Q6H PRN Salley Scarlet, MD         Lab Results:  Results for orders placed or performed during the hospital encounter of 01/14/21 (from the past 48 hour(s))  Lipid panel     Status: None   Collection Time: 01/15/21  7:34 AM  Result Value Ref Range   Cholesterol 125 0 - 200 mg/dL   Triglycerides 69 <150 mg/dL   HDL 47 >40 mg/dL   Total CHOL/HDL Ratio 2.7 RATIO   VLDL 14 0 - 40 mg/dL   LDL Cholesterol 64 0 - 99 mg/dL    Comment:        Total Cholesterol/HDL:CHD Risk Coronary Heart Disease Risk Table                     Men   Women  1/2 Average Risk   3.4   3.3  Average Risk       5.0   4.4  2 X Average Risk   9.6   7.1  3 X Average Risk  23.4   11.0        Use the calculated Patient Ratio above and the CHD Risk Table to determine the patient's CHD Risk.        ATP III CLASSIFICATION (LDL):  <100     mg/dL   Optimal  100-129  mg/dL   Near or Above                    Optimal  130-159  mg/dL   Borderline  160-189  mg/dL   High  >190     mg/dL   Very High Performed at [facility], [address] [place], [place] [address]   TSH     Status: None   Collection Time: 01/15/21  7:34 AM  Result Value Ref Range   TSH 0.988 0.350 - 4.500 uIU/mL    Comment: Performed by a 3rd Generation assay with a functional sensitivity of <=0.01 uIU/mL. Performed at [facility], [address] [place], [place] [address]     Blood Alcohol level:  Lab Results  Component Value Date   [facility] <10 19/50/9326    Metabolic Disorder Labs: No results found for: HGBA1C, MPG No results found for: PROLACTIN Lab Results  Component Value Date   CHOL 125 01/15/2021   TRIG 69 01/15/2021   HDL 47 01/15/2021   CHOLHDL 2.7 01/15/2021   VLDL 14 01/15/2021   LDLCALC 64 01/15/2021    Physical Findings: AIMS:  , ,  ,  ,  CIWA:    COWS:     Musculoskeletal: Strength & Muscle Tone: within normal limits Gait & Station: normal Patient leans: N/A  Psychiatric Specialty Exam:  Presentation  General  Appearance: Disheveled  Eye Contact:Fair  Speech:Pressured  Speech Volume:Normal  Handedness:Right   Mood and Affect  Mood:Labile  Affect:Labile; Tearful   Thought Process  Thought Processes:Disorganized  Descriptions of Associations:Circumstantial  Orientation:Full (Time, Place and Person)  Thought Content:Perseveration; Scattered  History of Schizophrenia/Schizoaffective disorder:No  Duration of Psychotic Symptoms:Less than six months  Hallucinations:Hallucinations: -- (Patient denies, but appears internally preoccupied)  Ideas of Reference:None  Suicidal Thoughts:Suicidal Thoughts: Yes, Passive  Homicidal Thoughts:Homicidal Thoughts: No   Sensorium  Memory:Immediate Poor; Recent Fair; Remote [place]   Executive Functions  Concentration:Poor  Attention Span:Poor  [place]   Psychomotor Activity  Psychomotor Activity:Psychomotor Activity: Increased   Assets  Assets:Desire for Improvement; Housing; Social Support; Talents/Skills; Vocational/Educational   Sleep  Sleep:Sleep: Poor    Physical Exam: Physical Exam Vitals and nursing note reviewed.  Constitutional:      Appearance: Normal appearance.  HENT:     Head: Normocephalic and atraumatic.     Mouth/Throat:     Pharynx: Oropharynx is clear.  Eyes:     Pupils: Pupils are equal, round, and reactive to light.  Cardiovascular:     Rate and Rhythm: Normal rate and regular rhythm.  Pulmonary:     Effort: Pulmonary effort is normal.     Breath sounds: Normal breath sounds.  Abdominal:     General: Abdomen is flat.     Palpations: Abdomen is soft.  Musculoskeletal:        General: Normal range of motion.  Skin:    General: Skin is warm and dry.  Neurological:     General: No focal deficit present.     Mental Status: She is alert. Mental status is at baseline.  Psychiatric:        Attention and  Perception: She is inattentive.        Mood and Affect: Mood is anxious. Affect is labile.        Speech: Speech is rapid and pressured and tangential.        Behavior: Behavior is agitated and hyperactive. Behavior is not aggressive.        Thought Content: Thought content is paranoid. Thought content does not include homicidal or suicidal ideation.        Cognition and Memory: Cognition is impaired. Memory is impaired.        Judgment: Judgment is inappropriate.    Review of Systems  Constitutional: Negative.   HENT: Negative.   Eyes: Negative.   Respiratory: Negative.   Cardiovascular: Negative.   Gastrointestinal: Negative.   Musculoskeletal: Negative.   Skin: Negative.   Neurological: Negative.   Psychiatric/Behavioral: Positive for memory loss. Negative for depression, hallucinations, substance abuse and suicidal ideas. The patient is nervous/anxious and has insomnia.    Blood pressure 133/86, pulse 94, temperature 98.6 F (37 C), temperature source Oral, resp. rate 18, height 5\' 5"  (1.651 m), weight 73.9 kg, SpO2 100 %. Body mass index is 27.12 kg/m.   Treatment Plan Summary: Daily contact with patient to assess and evaluate symptoms and progress in treatment, Medication management and Plan Patient continues to appear psychotic.  The persistence of her illness makes it less likely that this is all driven by substances and more likely that this is either bipolar disorder or  a brief reactive psychosis.  So far medication does not seem to have made much difference.  Increase Zyprexa dose to 15 mg at night and 5 in the day.  Alter as needed dose so that Ativan can be given orally or by IM injection if needed.  Reordered EKG as it had not been performed yet.  Tried to do supportive counseling with the patient.  At this point not showing behaviors that would require one-to-one.  Nursing aware of her needs and are keeping up with it.  Vital signs appear stable no acute medical needs  identified.  Alethia Berthold, MD 01/15/2021, 11:30 AM

## 2021-01-15 NOTE — Progress Notes (Signed)
Patient alert and oriented x 2 with periods of confusion to situation and place, her thoughts are disorganized and incoherent, she appears irritable and restless, pacing the unit, and not receptive to staff. Patient is intrusive at the nursing station with multiple request at same time, and when nursing staff come to the door she becomes dismissive and walks away. Patient refused evening scheduled medication, she appears delusional responding to internal stimuli. 15 minutes safety checks maintained will continue to monitor.
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
## 2021-01-15 NOTE — BHH Counselor (Signed)
CSW unable to complete PSA, patient was given PRN for agitation. CSW will make additional attempts to complete PSA at a later time.   Signed:  Durenda Hurt, MSW, [place], LCASA 01/15/2021 4:22 PM

## 2021-01-15 NOTE — BHH Group Notes (Signed)
LCSW Group Therapy Note  01/15/2021 1:42 PM  Type of Therapy and Topic:  Group Therapy: Avoiding Self-Sabotaging and Enabling Behaviors  Participation Level:  Did Not Attend   Description of Group:   In this group, patients will learn how to identify obstacles, self-sabotaging and enabling behaviors, as well as: what are they, why do we do them and what needs these behaviors meet. Discuss unhealthy relationships and how to have positive healthy boundaries with those that sabotage and enable. Explore aspects of self-sabotage and enabling in yourself and how to limit these self-destructive behaviors in everyday life.   Therapeutic Goals: 1. Patient will identify one obstacle that relates to self-sabotage and enabling behaviors 2. Patient will identify one personal self-sabotaging or enabling behavior they did prior to admission 3. Patient will state a plan to change the above identified behavior 4. Patient will demonstrate ability to communicate their needs through discussion and/or role play.   Summary of Patient Progress: Patient did not attend group despite encouraged participation.     Therapeutic Modalities:   Cognitive Behavioral Therapy Person-Centered Therapy Motivational Interviewing   Paulla Dolly, MSW, [place], [place] 01/15/2021 1:42 PM

## 2021-01-16 DIAGNOSIS — F29 Unspecified psychosis not due to a substance or known physiological condition: Secondary | ICD-10-CM | POA: Diagnosis not present

## 2021-01-16 LAB — URINE DRUG SCREEN, QUALITATIVE (ARMC ONLY)
Amphetamines, Ur Screen: NOT DETECTED
Barbiturates, Ur Screen: NOT DETECTED
Benzodiazepine, Ur Scrn: POSITIVE — AB
Cannabinoid 50 Ng, Ur ~~LOC~~: POSITIVE — AB
Cocaine Metabolite,Ur ~~LOC~~: NOT DETECTED
MDMA (Ecstasy)Ur Screen: NOT DETECTED
Methadone Scn, Ur: NOT DETECTED
Opiate, Ur Screen: NOT DETECTED
Phencyclidine (PCP) Ur S: NOT DETECTED
Tricyclic, Ur Screen: NOT DETECTED

## 2021-01-16 LAB — RPR: RPR Ser Ql: NONREACTIVE

## 2021-01-16 NOTE — BHH Suicide Risk Assessment (Signed)
[place] INPATIENT:  Family/Significant Other Suicide Prevention Education  Suicide Prevention Education:  Education Completed; [name] [place],  (Husband) has been identified by the patient as the family member/significant other with whom the patient will be residing, and identified as the person(s) who will aid the patient in the event of a mental health crisis (suicidal ideations/suicide attempt).  With written consent from the patient, the family member/significant other has been provided the following suicide prevention education, prior to the and/or following the discharge of the patient.  The suicide prevention education provided includes the following:  Suicide risk factors  Suicide prevention and interventions  National Suicide Hotline telephone number  [facility] assessment telephone number  [facility] Emergency Assistance [place] and/or Residential Mobile Crisis Unit telephone number  Request made of family/significant other to:  Remove weapons (e.g., guns, rifles, knives), all items previously/currently identified as safety concern.    Remove drugs/medications (over-the-counter, prescriptions, illicit drugs), all items previously/currently identified as a safety concern.  The family member/significant other verbalizes understanding of the suicide prevention education information provided.  The family member/significant other agrees to remove the items of safety concern listed above.  [place]  Taronda Comacho 01/16/2021, 2:30 PM

## 2021-01-16 NOTE — Progress Notes (Signed)
Pt awake shortly after shift change, tearful at nurses station. Pt reports she was worried about and missed her children. Pt states she did not know who had her kids. Pt then received phone call from spouse informing her that he has the kids. Pt was relieved and reported feeling better. Pt with no confusion,  No disorganization. Asked Probation officer for change of clothing, reports she had a rough day earlier and wanted to shower. Change of clothes and toiletries provided. Pt then asked for a comb and products to comb hair. Pt did eat dinner tray that was left for patient as well as snack. Took hs medications without complication. No problems or concerns noticed or voiced. Encouragement and support provided. Safety checks maintained. Medications given as prescribed. Pt receptive and remains safe on unit with q15 min checks.

## 2021-01-16 NOTE — Progress Notes (Addendum)
Pt has been alert and oriented to person, place, time, and situation. Pt has been calm, and cooperative for most of the day with the exception of this morning. Pt was angry and yelling, "If I don't get out of here. I'm going to blow this place up!" Pt Reports she feels this way due to feeling frustrated about being in the hospital and not being able to open her windows or go outdoors when she wanted to. Pt reported, "I feel like a caged animal." Pt was verbally redirected and that was effective to deescalate pt. Pt reports anxiety, requested medication for help with it, was given PRN Ativan 2mg  PO which was effective, see MAR for those details. Pt thanked this Probation officer for the help and reports that she felt less much less anxious later. Pt has napped a few times this shift. Pt was medication compliant with AM medications. Pt attended outdoor recreation group with the [facility] and reported she enjoyed herself. Pt talked on the telephone with her husband a few times today. Pt reports she misses her children. Pt is focused on discharge. Pt's appetite is good, and reports she slept well last night. Pt's thoughts are much more organized today than yesterday. Pt was compliant with request for providing a urine sample. Will continue to monitor pt per Q15 minute face checks and monitor for safety and progress.  Pt denies suicidal and homicidal ideation, denies hallucinations, denies feelings of depression and anxiety at this time. Has been pleasant, and cooperative with obtaining EKG. Will continue to monitor pt per Q15 minute face checks and monitor for safety and progress.

## 2021-01-16 NOTE — Progress Notes (Signed)
Pt awakened this am pleasant and cooperative. Fixation on being pregnant this am. Reports she almost vomited this am. Reports she and husband are trying to have another baby. Pt reports her cycle in on, but still believes she may be pregnant. Pt requesting urine pregnancy test.
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
## 2021-01-17 ENCOUNTER — Other Ambulatory Visit: Payer: Self-pay

## 2021-01-17 DIAGNOSIS — F29 Unspecified psychosis not due to a substance or known physiological condition: Secondary | ICD-10-CM | POA: Diagnosis not present

## 2021-01-17 LAB — HEMOGLOBIN A1C
Hgb A1c MFr Bld: 5.4 % (ref 4.8–5.6)
Mean Plasma Glucose: 108 mg/dL

## 2021-01-17 MED ORDER — HYDROXYZINE HCL 10 MG PO TABS
10.0000 mg | ORAL_TABLET | Freq: Three times a day (TID) | ORAL | 0 refills | Status: DC | PRN
  Filled 2021-01-17: qty 21, 7d supply, fill #0

## 2021-01-17 MED ORDER — OLANZAPINE 10 MG PO TABS
ORAL_TABLET | ORAL | 0 refills | Status: DC
  Filled 2021-01-17: qty 14, 7d supply, fill #0

## 2021-01-17 NOTE — Progress Notes (Signed)
Recreation Therapy Notes  INPATIENT RECREATION THERAPY ASSESSMENT  Patient Details Name: Melanie Murray MRN: [id number] DOB: 1985/11/10 Today's Date: 01/17/2021       Information Obtained From: Patient  Able to Participate in Assessment/Interview: Yes  Patient Presentation: Responsive,Hyperverbal  Reason for Admission (Per Patient): Patient Unable to Identify  Patient Stressors:    Coping Skills:   Equities trader (Comment) (Work, Smoke)  Leisure Interests (2+):  [place] - TV (Clean up)  Frequency of Recreation/Participation: Weekly  Awareness of Community Resources:  Yes  Community Resources:  [place]  Current Use: No  If no, Barriers?: Financial,Transportation  Expressed Interest in [employer] Information: Yes  County of Residence:  [place]  Patient Main Form of Transportation: Walk  Patient Strengths:  Hard worker  Patient Identified Areas of Improvement:  My anger  Patient Goal for Hospitalization:  To go home  Current SI (including self-harm):  No  Current HI:  No  Current AVH: No  Staff Intervention Plan: Group Attendance,Collaborate with Interdisciplinary Treatment Team  Consent to Intern Participation: N/A  Melanie Murray 01/17/2021, 10:56 AM

## 2021-01-17 NOTE — Plan of Care (Signed)
Patient came to staff this morning and states " give me a high dose of anxiety medicine. I never been away from my kids this long. I am not crazy." Ativan given. Patient insisting for discharge this afternoon  also. Patient withdrawn to her room most of the shift. Denies SI,HI and AVH. Appetite and energy level good. Support and encouragement given.

## 2021-01-17 NOTE — Progress Notes (Signed)
Recreation Therapy Notes  INPATIENT RECREATION TR PLAN  Patient Details Name: Donatella Walski MRN: [id number] DOB: [birth year] Today's Date: 01/17/2021  Rec Therapy Plan Is patient appropriate for Therapeutic Recreation?: Yes Treatment times per week: at least 3 Estimated Length of Stay: 5-7 days TR Treatment/Interventions: Group participation (Comment)  Discharge Criteria Pt will be discharged from therapy if:: Discharged Treatment plan/goals/alternatives discussed and agreed upon by:: Patient/family  Discharge Summary     Elya Tarquinio 01/17/2021, 10:57 AM
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
## 2021-01-17 NOTE — Tx Team (Signed)
Interdisciplinary Treatment and Diagnostic Plan Update  01/17/2021 Time of Session: 9:00AM Emiya Loomer MRN: [id number]  Principal Diagnosis: Psychosis [facility])  Secondary Diagnoses: Principal Problem:   Psychosis ([place]) Active Problems:   Cannabis abuse   Current Medications:  Current Facility-Administered Medications  Medication Dose Route Frequency Provider Last Rate Last Admin  . acetaminophen (TYLENOL) tablet 650 mg  650 mg Oral Q6H PRN Clapacs, Madie Reno, MD   650 mg at 01/17/21 0720  . alum & mag hydroxide-simeth (MAALOX/MYLANTA) 200-200-20 MG/5ML suspension 30 mL  30 mL Oral Q4H PRN Clapacs, John T, MD      . LORazepam (ATIVAN) tablet 2 mg  2 mg Oral Q6H PRN Clapacs, Madie Reno, MD   2 mg at 01/17/21 0900   Or  . LORazepam (ATIVAN) injection 2 mg  2 mg Intramuscular Q6H PRN Clapacs, John T, MD      . magnesium hydroxide (MILK OF MAGNESIA) suspension 30 mL  30 mL Oral Daily PRN Clapacs, John T, MD      . nicotine (NICODERM CQ - dosed in mg/24 hours) patch 21 mg  21 mg Transdermal Daily Salley Scarlet, MD   21 mg at 01/17/21 0720  . OLANZapine zydis (ZYPREXA) disintegrating tablet 15 mg  15 mg Oral QHS Clapacs, John T, MD   15 mg at 01/16/21 2230  . OLANZapine zydis (ZYPREXA) disintegrating tablet 5 mg  5 mg Oral Daily Clapacs, Madie Reno, MD   5 mg at 01/17/21 0720  . ziprasidone (GEODON) injection 20 mg  20 mg Intramuscular Q6H PRN Salley Scarlet, MD   20 mg at 01/15/21 1231   PTA Medications: No medications prior to admission.    Patient Stressors: Marital or family conflict Occupational concerns  Patient Strengths: Capable of independent living  Treatment Modalities: Medication Management, Group therapy, Case management,  1 to 1 session with clinician, Psychoeducation, Recreational therapy.   Physician Treatment Plan for Primary Diagnosis: Psychosis ([place]) Long Term Goal(s): Improvement in symptoms so as ready for discharge Improvement in symptoms so as ready for discharge    Short Term Goals: Ability to identify changes in lifestyle to reduce recurrence of condition will improve Ability to verbalize feelings will improve Ability to disclose and discuss suicidal ideas Ability to demonstrate self-control will improve Ability to identify and develop effective coping behaviors will improve Ability to maintain clinical measurements within normal limits will improve Compliance with prescribed medications will improve Ability to identify triggers associated with substance abuse/mental health issues will improve Ability to identify changes in lifestyle to reduce recurrence of condition will improve Ability to verbalize feelings will improve Ability to disclose and discuss suicidal ideas Ability to demonstrate self-control will improve Ability to identify and develop effective coping behaviors will improve Ability to maintain clinical measurements within normal limits will improve Compliance with prescribed medications will improve Ability to identify triggers associated with substance abuse/mental health issues will improve  Medication Management: Evaluate patient's response, side effects, and tolerance of medication regimen.  Therapeutic Interventions: 1 to 1 sessions, Unit Group sessions and Medication administration.  Evaluation of Outcomes: Not Met  Physician Treatment Plan for Secondary Diagnosis: Principal Problem:   Psychosis ([place]) Active Problems:   Cannabis abuse  Long Term Goal(s): Improvement in symptoms so as ready for discharge Improvement in symptoms so as ready for discharge   Short Term Goals: Ability to identify changes in lifestyle to reduce recurrence of condition will improve Ability to verbalize feelings will improve Ability to disclose and discuss  suicidal ideas Ability to demonstrate self-control will improve Ability to identify and develop effective coping behaviors will improve Ability to maintain clinical measurements within  normal limits will improve Compliance with prescribed medications will improve Ability to identify triggers associated with substance abuse/mental health issues will improve Ability to identify changes in lifestyle to reduce recurrence of condition will improve Ability to verbalize feelings will improve Ability to disclose and discuss suicidal ideas Ability to demonstrate self-control will improve Ability to identify and develop effective coping behaviors will improve Ability to maintain clinical measurements within normal limits will improve Compliance with prescribed medications will improve Ability to identify triggers associated with substance abuse/mental health issues will improve     Medication Management: Evaluate patient's response, side effects, and tolerance of medication regimen.  Therapeutic Interventions: 1 to 1 sessions, Unit Group sessions and Medication administration.  Evaluation of Outcomes: Not Met   RN Treatment Plan for Primary Diagnosis: Psychosis ([place]) Long Term Goal(s): Knowledge of disease and therapeutic regimen to maintain health will improve  Short Term Goals: Ability to remain free from injury will improve, Ability to verbalize frustration and anger appropriately will improve, Ability to demonstrate self-control, Ability to participate in decision making will improve, Ability to identify and develop effective coping behaviors will improve and Compliance with prescribed medications will improve  Medication Management: RN will administer medications as ordered by provider, will assess and evaluate patient's response and provide education to patient for prescribed medication. RN will report any adverse and/or side effects to prescribing provider.  Therapeutic Interventions: 1 on 1 counseling sessions, Psychoeducation, Medication administration, Evaluate responses to treatment, Monitor vital signs and CBGs as ordered, Perform/monitor CIWA, COWS, AIMS and Fall Risk  screenings as ordered, Perform wound care treatments as ordered.  Evaluation of Outcomes: Not Met   LCSW Treatment Plan for Primary Diagnosis: Psychosis ([place]) Long Term Goal(s): Safe transition to appropriate next level of care at discharge, Engage patient in therapeutic group addressing interpersonal concerns.  Short Term Goals: Engage patient in aftercare planning with referrals and resources, Increase social support, Increase ability to appropriately verbalize feelings, Increase emotional regulation, Facilitate acceptance of mental health diagnosis and concerns, Identify triggers associated with mental health/substance abuse issues and Increase skills for wellness and recovery  Therapeutic Interventions: Assess for all discharge needs, 1 to 1 time with Social worker, Explore available resources and support systems, Assess for adequacy in community support network, Educate family and significant other(s) on suicide prevention, Complete Psychosocial Assessment, Interpersonal group therapy.  Evaluation of Outcomes: Not Met   Progress in Treatment: Attending groups: No. Participating in groups: No. Taking medication as prescribed: Yes. Toleration medication: Yes. Family/Significant other contact made: Yes, individual(s) contacted:  Rosine Door, husband. Patient understands diagnosis: No. Discussing patient identified problems/goals with staff: No. Medical problems stabilized or resolved: Yes. Denies suicidal/homicidal ideation: Yes. Issues/concerns per patient self-inventory: No. Other: None.  New problem(s) identified: No, Describe:  None.  New Short Term/Long Term Goal(s): medication management for mood stabilization; development of comprehensive mental wellness/sobriety plan.  Patient Goals: "I want to work on going home."   Discharge Plan or Barriers: CSW will assist pt with development of an appropriate discharge/aftercare plan.  Reason for Continuation of Hospitalization:  [place] Medication stabilization  Estimated Length of Stay: 1-7 days  Attendees: Patient: Tatisha Cerino 01/17/2021 9:53 AM  Physician: Selina Cooley, MD 01/17/2021 9:53 AM  Nursing: West Pugh, RN 01/17/2021 9:53 AM  RN Care Manager: 01/17/2021 9:53 AM  Social Worker: Chalmers Guest. Guerry Bruin, MSW,  LCSW, LCAS 01/17/2021 9:53 AM  Recreational Therapist: Devin Going, LRT 01/17/2021 9:53 AM  Other:  01/17/2021 9:53 AM  Other:  01/17/2021 9:53 AM  Other: 01/17/2021 9:53 AM    Scribe for Treatment Team: Shirl Harris, LCSW 01/17/2021 9:53 AM

## 2021-01-17 NOTE — Progress Notes (Signed)
Recreation Therapy Notes   Date: 01/17/2021  Time: 9:30 am  Location: Craft room    Behavioral response: Appropriate   Intervention Topic: Time-Management    Discussion/Intervention:  Group content today was focused on time management. The group defined time management and identified healthy ways to manage time. Individuals expressed how much of the 24 hours they use in a day. Patients expressed how much time they use just for themselves personally. The group expressed how they have managed their time in the past. Individuals participated in the intervention "Managing Life" where they had a chance to see how much of the 24 hours they use and where it goes. Clinical Observations/Feedback:  Patient came to group and defined time- management as putting priorities first. She stated that she manages her time by putting work first, kids second and her husband last. Participant explained that she makes time for herself by reading a book/magazine and listening to music. Individual was social with staff while participating in the intervention.   Melanie Murray LRT/CTRS               Melanie Murray 01/17/2021 10:52 AM

## 2021-01-17 NOTE — Progress Notes (Signed)
[facility] MD Progress Note  01/17/2021 2:58 PM Melanie Murray  MRN:  [id number]  CC "I feel like a slave trapped in here."  Subjective:  35 year old female who presented to the emergency room for altered mental status and bizarre behavior. No acute events overnight, medication compliant, attending to ADLs. Patient seen during treatment team and again one-on-one at bedside. She states her goal is to go home as soon as possible. On exam, her mood remains labile. She alternates from irritable, to tearful, and back to laughing. Her overall thought pattern is much easier to follow, and more linear today. She discusses that how she was previously homeless and in and out of hotel rooms with her husband and children working multiple jobs. She was finally able to secure housing with the improved pay from [employer]. She is able to better articulate why this job is so important to her. She also discusses her upbringing and how her mother always instilled in her to care for her kids first and herself last. She brightens a great deal when discussing her children and their accomplishments. She notes that this is the longest she has ever been away from her children, and she feels that this is causing a great deal of stress and depression for her. She desires return home.   Spoke with patient's husband, Melanie Murray, [phone]. He notes that this is the first time she has ever had a mental health crisis. He was able to see her in person on Friday, and notes she was very confused at that time. He has been speaking to her on the phone, and notes she has improved tremendously. He feels that she is now speaking at the same rate as usual. He does not feel she is a danger to herself, or anyone else. He feels comfortable with her returning home, and seeking outpatient treatment. He notes they are separated at this time, but he plans to support her and the kids while she transitions back home.  Principal Problem: Psychosis  ([place]) Diagnosis: Principal Problem:   Psychosis ([place]) Active Problems:   Cannabis abuse  Total Time spent with patient: 45 minutes  Past Psychiatric History: See H&P  Past Medical History: History reviewed. No pertinent past medical history. History reviewed. No pertinent surgical history. Family History: History reviewed. No pertinent family history. Family Psychiatric  History: See H&P Social History:  Social History   Substance and Sexual Activity  Alcohol Use Yes  . Alcohol/week: 1.0 standard drink  . Types: 1 Standard drinks or equivalent per week     Social History   Substance and Sexual Activity  Drug Use Not on file    Social History   Socioeconomic History  . Marital status: Divorced    Spouse name: Not on file  . Number of children: Not on file  . Years of education: Not on file  . Highest education level: Not on file  Occupational History  . Not on file  Tobacco Use  . Smoking status: Current Every Day Smoker    Packs/day: 1.00    Types: Cigarettes  . Smokeless tobacco: Never Used  Substance and Sexual Activity  . Alcohol use: Yes    Alcohol/week: 1.0 standard drink    Types: 1 Standard drinks or equivalent per week  . Drug use: Not on file  . Sexual activity: Not on file  Other Topics Concern  . Not on file  Social History Narrative  . Not on file   Social Determinants of Health  Financial Resource Strain: Not on file  Food Insecurity: Not on file  Transportation Needs: Not on file  Physical Activity: Not on file  Stress: Not on file  Social Connections: Not on file   Additional Social History:                         Sleep: Good  Appetite:  Good  Current Medications: Current Facility-Administered Medications  Medication Dose Route Frequency Provider Last Rate Last Admin  . acetaminophen (TYLENOL) tablet 650 mg  650 mg Oral Q6H PRN Clapacs, Madie Reno, MD   650 mg at 01/17/21 0720  . alum & mag hydroxide-simeth  (MAALOX/MYLANTA) 200-200-20 MG/5ML suspension 30 mL  30 mL Oral Q4H PRN Clapacs, John T, MD      . LORazepam (ATIVAN) tablet 2 mg  2 mg Oral Q6H PRN Clapacs, Madie Reno, MD   2 mg at 01/17/21 0900   Or  . LORazepam (ATIVAN) injection 2 mg  2 mg Intramuscular Q6H PRN Clapacs, John T, MD      . magnesium hydroxide (MILK OF MAGNESIA) suspension 30 mL  30 mL Oral Daily PRN Clapacs, John T, MD      . nicotine (NICODERM CQ - dosed in mg/24 hours) patch 21 mg  21 mg Transdermal Daily Salley Scarlet, MD   21 mg at 01/17/21 0720  . OLANZapine zydis (ZYPREXA) disintegrating tablet 15 mg  15 mg Oral QHS Clapacs, John T, MD   15 mg at 01/16/21 2230  . OLANZapine zydis (ZYPREXA) disintegrating tablet 5 mg  5 mg Oral Daily Clapacs, Madie Reno, MD   5 mg at 01/17/21 0720  . ziprasidone (GEODON) injection 20 mg  20 mg Intramuscular Q6H PRN Salley Scarlet, MD   20 mg at 01/15/21 1231    Lab Results:  Results for orders placed or performed during the hospital encounter of 01/14/21 (from the past 48 hour(s))  Urine Drug Screen, Qualitative     Status: Abnormal   Collection Time: 01/16/21 11:01 AM  Result Value Ref Range   Tricyclic, Ur Screen NONE DETECTED NONE DETECTED   Amphetamines, Ur Screen NONE DETECTED NONE DETECTED   MDMA (Ecstasy)Ur Screen NONE DETECTED NONE DETECTED   Cocaine Metabolite,Ur [place] NONE DETECTED NONE DETECTED   Opiate, Ur Screen NONE DETECTED NONE DETECTED   Phencyclidine (PCP) Ur S NONE DETECTED NONE DETECTED   Cannabinoid 50 Ng, Ur [place] POSITIVE (A) NONE DETECTED   Barbiturates, Ur Screen NONE DETECTED NONE DETECTED   Benzodiazepine, Ur Scrn POSITIVE (A) NONE DETECTED   Methadone Scn, Ur NONE DETECTED NONE DETECTED    Comment: (NOTE) Tricyclics + metabolites, urine    Cutoff 1000 ng/mL Amphetamines + metabolites, urine  Cutoff 1000 ng/mL MDMA (Ecstasy), urine              Cutoff 500 ng/mL Cocaine Metabolite, urine          Cutoff 300 ng/mL Opiate + metabolites, urine        Cutoff 300  ng/mL Phencyclidine (PCP), urine         Cutoff 25 ng/mL Cannabinoid, urine                 Cutoff 50 ng/mL Barbiturates + metabolites, urine  Cutoff 200 ng/mL Benzodiazepine, urine              Cutoff 200 ng/mL Methadone, urine  Cutoff 300 ng/mL  The urine drug screen provides only a preliminary, unconfirmed analytical test result and should not be used for non-medical purposes. Clinical consideration and professional judgment should be applied to any positive drug screen result due to possible interfering substances. A more specific alternate chemical method must be used in order to obtain a confirmed analytical result. Gas chromatography / mass spectrometry (GC/MS) is the preferred confirm atory method. Performed at [facility], [address] [place], [place] [address]     Blood Alcohol level:  Lab Results  Component Value Date   [facility] <10 10/93/2355    Metabolic Disorder Labs: Lab Results  Component Value Date   HGBA1C 5.4 01/15/2021   MPG 108 01/15/2021   No results found for: PROLACTIN Lab Results  Component Value Date   CHOL 125 01/15/2021   TRIG 69 01/15/2021   HDL 47 01/15/2021   CHOLHDL 2.7 01/15/2021   VLDL 14 01/15/2021   LDLCALC 64 01/15/2021    Physical Findings: AIMS:  , ,  ,  ,    CIWA:    COWS:     Musculoskeletal: Strength & Muscle Tone: within normal limits Gait & Station: normal Patient leans: N/A  Psychiatric Specialty Exam:  Presentation  General Appearance: Casual  Eye Contact:Good  Speech:Normal Rate  Speech Volume:Normal  Handedness:Right   Mood and Affect  Mood:Labile  Affect:Labile; Tearful   Thought Process  Thought Processes:Goal Directed  Descriptions of Associations:Circumstantial  Orientation:Full (Time, Place and Person)  Thought Content:Scattered  History of Schizophrenia/Schizoaffective disorder:No  Duration of Psychotic Symptoms:Less than six  months  Hallucinations:Hallucinations: None  Ideas of Reference:None  Suicidal Thoughts:Suicidal Thoughts: No  Homicidal Thoughts:Homicidal Thoughts: No   Sensorium  Memory:Immediate Fair; Recent Fair; Remote Fair  Judgment:Intact  Insight:Shallow   Executive Functions  Concentration:Fair  Attention Span:Fair  [place] of Knowledge:Fair  Language:Fair   Psychomotor Activity  Psychomotor Activity:Psychomotor Activity: Normal   Assets  Assets:Communication Skills; Desire for Improvement; Housing; Leisure Time; Physical Health; Resilience; Social Support; Talents/Skills; Vocational/Educational   Sleep  Sleep:Sleep: Good Number of Hours of Sleep: 9    Physical Exam: Physical Exam ROS Blood pressure 123/63, pulse 79, temperature 98.7 F (37.1 C), temperature source Oral, resp. rate 18, height 5\' 5"  (1.651 m), weight 73.9 kg, SpO2 100 %. Body mass index is 27.12 kg/m.   Treatment Plan Summary: Daily contact with patient to assess and evaluate symptoms and progress in treatment and Medication management 35 year old female presenting with altered mental status and psychosis. Presentation seems most consistent with bipolar disorder, however differential includes  mixed episode of depression with psychotic features, brief psychotic disorder, or substance induced psychosis. Continue Zyprexa 5 mg in the morning, 15 mg in the evening.   Salley Scarlet, MD 01/17/2021, 2:58 PM

## 2021-01-17 NOTE — Plan of Care (Signed)
  Problem: Education: Goal: Knowledge of [facility] Education information/materials will improve Outcome: Progressing Goal: Emotional status will improve Outcome: Progressing Goal: Mental status will improve Outcome: Progressing Goal: Verbalization of understanding the information provided will improve Outcome: Progressing   Problem: Activity: Goal: Interest or engagement in activities will improve Outcome: Progressing Goal: Sleeping patterns will improve Outcome: Progressing   

## 2021-01-17 NOTE — Progress Notes (Signed)
Patient presents with bright affect. No psychotic episodes noted. Denies any SI, HI, AVH. Medication compliant. Is hopeful with the possibility of discharge on tomorrow. Voiced no concerns or complaints. Completes ADLs with no assistance. Encouragement and support provided. Safety checks maintained. Medications given as prescribed. Pt receptive and remains safe on unit with q 15 min checks.

## 2021-01-17 NOTE — Plan of Care (Signed)
  Problem: Education: Goal: Knowledge of [facility] Education information/materials will improve Outcome: Progressing Goal: Emotional status will improve Outcome: Progressing Goal: Mental status will improve Outcome: Progressing Goal: Verbalization of understanding the information provided will improve Outcome: Progressing   Problem: Activity: Goal: Interest or engagement in activities will improve Outcome: Progressing Goal: Sleeping patterns will improve Outcome: Progressing   Problem: Coping: Goal: Ability to verbalize frustrations and anger appropriately will improve Outcome: Progressing Goal: Ability to demonstrate self-control will improve Outcome: Progressing   Problem: Health Behavior/Discharge Planning: Goal: Identification of resources available to assist in meeting health care needs will improve Outcome: Progressing Goal: Compliance with treatment plan for underlying cause of condition will improve Outcome: Progressing   Problem: Physical Regulation: Goal: Ability to maintain clinical measurements within normal limits will improve Outcome: Progressing   Problem: Safety: Goal: Periods of time without injury will increase Outcome: Progressing   Problem: Activity: Goal: Will verbalize the importance of balancing activity with adequate rest periods Outcome: Progressing   Problem: Education: Goal: Will be free of psychotic symptoms Outcome: Progressing Goal: Knowledge of the prescribed therapeutic regimen will improve Outcome: Progressing   Problem: Coping: Goal: Coping ability will improve Outcome: Progressing Goal: Will verbalize feelings Outcome: Progressing   Problem: Health Behavior/Discharge Planning: Goal: Compliance with prescribed medication regimen will improve Outcome: Progressing   Problem: Nutritional: Goal: Ability to achieve adequate nutritional intake will improve Outcome: Progressing   Problem: Role Relationship: Goal:  Ability to communicate needs accurately will improve Outcome: Progressing Goal: Ability to interact with others will improve Outcome: Progressing   Problem: Safety: Goal: Ability to redirect hostility and anger into socially appropriate behaviors will improve Outcome: Progressing Goal: Ability to remain free from injury will improve Outcome: Progressing   Problem: Self-Care: Goal: Ability to participate in self-care as condition permits will improve Outcome: Progressing   Problem: Self-Concept: Goal: Will verbalize positive feelings about self Outcome: Progressing   

## 2021-01-17 NOTE — Progress Notes (Addendum)
Pt sated that she did not sleep well last night and that she thought she was wandering the hallways during the night shift. She presents as anxious. She is seen pacing the hallways back and forth. She rated her anxiety a 10/10 and was given Ativan 2mg  PO. She also said she had a headache rated 10/10 and was given Tylenol. She had a crying spell and discussed being upset about not being able to see her children. She denies SI, HI and AVH. Emotional support and relaxation techniques were given to her. Her goal today includes managing her anxiety and getting some rest. We will continue to monitor with 15 minute safety checks.

## 2021-01-17 NOTE — BHH Group Notes (Signed)
LCSW Group Therapy Note   01/17/2021 1:51 PM  Type of Therapy and Topic:  Group Therapy:  Overcoming Obstacles   Participation Level:  Active   Description of Group:    In this group patients will be encouraged to explore what they see as obstacles to their own wellness and recovery. They will be guided to discuss their thoughts, feelings, and behaviors related to these obstacles. The group will process together ways to cope with barriers, with attention given to specific choices patients can make. Each patient will be challenged to identify changes they are motivated to make in order to overcome their obstacles. This group will be process-oriented, with patients participating in exploration of their own experiences as well as giving and receiving support and challenge from other group members.   Therapeutic Goals: 1. Patient will identify personal and current obstacles as they relate to admission. 2. Patient will identify barriers that currently interfere with their wellness or overcoming obstacles.  3. Patient will identify feelings, thought process and behaviors related to these barriers. 4. Patient will identify two changes they are willing to make to overcome these obstacles:      Summary of Patient Progress Patient was present for the entirety of group. She identified overworking as an obstacle for her and stated that need for money and lack of transportation were barriers to her overcoming this obstacle. Pt discussed her work environment and her need to refocus on just doing her job as one way to reduce stress/feeling overworked.     Therapeutic Modalities:   Cognitive Behavioral Therapy Solution Focused Therapy Motivational Interviewing Relapse Prevention Therapy  Hedy Camara R. Guerry Bruin, MSW, LCSW, [place] 01/17/2021 1:51 PM

## 2021-01-18 DIAGNOSIS — F29 Unspecified psychosis not due to a substance or known physiological condition: Secondary | ICD-10-CM | POA: Diagnosis not present

## 2021-01-18 MED ORDER — HYDROXYZINE HCL 10 MG PO TABS: 10.0000 mg | ORAL_TABLET | Freq: Three times a day (TID) | ORAL | 1 refills | Status: AC | PRN

## 2021-01-18 MED ORDER — OLANZAPINE 10 MG PO TABS: ORAL_TABLET | ORAL | 1 refills | Status: AC

## 2021-01-18 NOTE — Progress Notes (Signed)
  [facility] Adult Case Management Discharge Plan :  Will you be returning to the same living situation after discharge:  Yes,  pt's home At discharge, do you have transportation home?: Yes,  pt's husband Do you have the ability to pay for your medications: No.  Release of information consent forms completed and in the chart;  Patient's signature needed at discharge.  Patient to Follow up at:  Follow-up Information    [facility] Follow up on 01/21/2021.   Why: Hospital Follow-up scheduled for  Friday 4/22 at 9:30am, thanks! Contact information: [address] [place] [address] [phone]               Next level of care provider has access to [place] and Suicide Prevention discussed: Yes,  pt's husband     Has patient been referred to the Quitline?: Patient refused referral  Patient has been referred for addiction treatment: Pt. refused referral  Shereena Berquist A [place], [place] 01/18/2021, 9:43 AM

## 2021-01-18 NOTE — BHH Suicide Risk Assessment (Signed)
[facility] Discharge Suicide Risk Assessment   Principal Problem: Brief psychotic disorder [facility]) Discharge Diagnoses: Principal Problem:   Brief psychotic disorder ([place]) Active Problems:   Cannabis abuse   Total Time spent with patient: 35 minutes- 25 minutes face-to-face contact with patient, 10 minutes documentation, coordination of care, scripts   Musculoskeletal: Strength & Muscle Tone: within normal limits Gait & Station: normal Patient leans: N/A  Psychiatric Specialty Exam: Review of Systems  Constitutional: Negative.   HENT: Negative.   Eyes: Negative.   Respiratory: Negative.   Cardiovascular: Negative.   Gastrointestinal: Negative.   Endocrine: Negative.   Genitourinary: Negative.   Musculoskeletal: Negative.   Skin: Negative.   Allergic/Immunologic: Negative.   Neurological: Negative.   Hematological: Negative.   Psychiatric/Behavioral: Negative for hallucinations, self-injury, sleep disturbance and suicidal ideas. The patient is nervous/anxious.     Blood pressure 115/84, pulse 71, temperature 98.6 F (37 C), temperature source Oral, resp. rate 17, height 5\' 5"  (1.651 m), weight 73.9 kg, SpO2 98 %.Body mass index is 27.12 kg/m.  General Appearance: Casual  Eye Contact::  Good  Speech:  Clear and Coherent  Volume:  Normal  Mood:  Anxious  Affect:  Congruent and tearful when discussing how she misses her children  Thought Process:  Coherent  Orientation:  Full (Time, Place, and Person)  Thought Content:  Logical  Suicidal Thoughts:  No  Homicidal Thoughts:  No  Memory:  Immediate;   Fair Recent;   Fair Remote;   Fair  Judgement:  Intact  Insight:  Fair  Psychomotor Activity:  Normal  Concentration:  Fair  Recall:  [employer] of Knowledge:Fair  Language: Fair  Akathisia:  Negative  Handed:  Right  AIMS (if indicated):     Assets:  Communication Skills Desire for Improvement Housing Physical Health Resilience Social  Support Talents/Skills Vocational/Educational  Sleep:  Number of Hours: 8  Cognition: WNL  ADL's:  Intact   Mental Status Per Nursing Assessment::   On Admission:  NA  Demographic Factors:  NA  Loss Factors: Loss of significant relationship  Historical Factors: Family history of mental illness or substance abuse  Risk Reduction Factors:   Responsible for children under 45 years of age, Sense of responsibility to family, Religious beliefs about death, Employed, Living with another person, especially a relative, Positive social support, Positive therapeutic relationship and Positive coping skills or problem solving skills  Continued Clinical Symptoms:  Severe Anxiety and/or Agitation Previous Psychiatric Diagnoses and Treatments  Cognitive Features That Contribute To Risk:  None    Suicide Risk:  Minimal: No identifiable suicidal ideation.  Patients presenting with no risk factors but with morbid ruminations; may be classified as minimal risk based on the severity of the depressive symptoms   Follow-up Information    [facility] Follow up on 01/21/2021.   Why: Hospital Follow-up scheduled for  Friday 4/22 at 9:30am, thanks! Contact information: [place] [address] [phone]               Plan Of Care/Follow-up recommendations:  Activity:  as tolerated Diet:  regular diet  Salley Scarlet, MD 01/18/2021, 10:06 AM
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
## 2021-01-18 NOTE — Plan of Care (Signed)
Pt denies depression, SI, HI and AVH. Pt received a PRN for anxiety. Pt was educated on care plan and verbalizes understanding. Collier Bullock RN Problem: Education: Goal: Knowledge of [facility] Education information/materials will improve Outcome: Adequate for Discharge Goal: Emotional status will improve Outcome: Adequate for Discharge Goal: Mental status will improve Outcome: Adequate for Discharge Goal: Verbalization of understanding the information provided will improve Outcome: Adequate for Discharge   Problem: Activity: Goal: Interest or engagement in activities will improve Outcome: Adequate for Discharge Goal: Sleeping patterns will improve Outcome: Adequate for Discharge   Problem: Coping: Goal: Ability to verbalize frustrations and anger appropriately will improve Outcome: Adequate for Discharge Goal: Ability to demonstrate self-control will improve Outcome: Adequate for Discharge   Problem: Health Behavior/Discharge Planning: Goal: Identification of resources available to assist in meeting health care needs will improve Outcome: Adequate for Discharge Goal: Compliance with treatment plan for underlying cause of condition will improve Outcome: Adequate for Discharge   Problem: Physical Regulation: Goal: Ability to maintain clinical measurements within normal limits will improve Outcome: Adequate for Discharge   Problem: Safety: Goal: Periods of time without injury will increase Outcome: Adequate for Discharge   Problem: Activity: Goal: Will verbalize the importance of balancing activity with adequate rest periods Outcome: Adequate for Discharge   Problem: Education: Goal: Will be free of psychotic symptoms Outcome: Adequate for Discharge Goal: Knowledge of the prescribed therapeutic regimen will improve Outcome: Adequate for Discharge   Problem: Coping: Goal: Coping ability will improve Outcome: Adequate for Discharge Goal: Will verbalize  feelings Outcome: Adequate for Discharge   Problem: Health Behavior/Discharge Planning: Goal: Compliance with prescribed medication regimen will improve Outcome: Adequate for Discharge   Problem: Nutritional: Goal: Ability to achieve adequate nutritional intake will improve Outcome: Adequate for Discharge   Problem: Role Relationship: Goal: Ability to communicate needs accurately will improve Outcome: Adequate for Discharge Goal: Ability to interact with others will improve Outcome: Adequate for Discharge   Problem: Safety: Goal: Ability to redirect hostility and anger into socially appropriate behaviors will improve Outcome: Adequate for Discharge Goal: Ability to remain free from injury will improve Outcome: Adequate for Discharge   Problem: Self-Care: Goal: Ability to participate in self-care as condition permits will improve Outcome: Adequate for Discharge   Problem: Self-Concept: Goal: Will verbalize positive feelings about self Outcome: Adequate for Discharge

## 2021-01-18 NOTE — Progress Notes (Signed)
Pt denies SI, HI and AVH. Pt was educated on dc plan and verbalizes understanding. Pt received belongings, medications, prescriptions, AVS and dc packet. Collier Bullock RN

## 2021-01-18 NOTE — BHH Counselor (Addendum)
CSW discussed if pt wanted aftercare follow-up with outpatient services and pt originally declined. CSW follow up with pt and pt stated that she would be interested in outpatient aftercare. CSW had pt sign consent and set up follow-up appointment with [facility].   Atiyah Bauer [place], MSW, LCSW-A 4/19/20229:18 AM

## 2021-01-26 ENCOUNTER — Telehealth: Payer: Self-pay | Admitting: Licensed Clinical Social Worker

## 2021-01-26 NOTE — Telephone Encounter (Signed)
Patient left message that she would like to schedule an appointment to "get tested for cancer." Attempted to reach patient to find out more/see if she would like a genetic counseling appointment, but her voicemail was full and I could not leave a message. Will try calling her back tomorrow.

## 2021-01-31 ENCOUNTER — Telehealth: Payer: Self-pay | Admitting: Licensed Clinical Social Worker

## 2021-01-31 NOTE — Telephone Encounter (Signed)
Attempted to return voicemail left by Ms. Laprise regarding testing, left message at [phone] and provided my contact info

## 2021-04-24 ENCOUNTER — Emergency Department
Admission: EM | Admit: 2021-04-24 | Discharge: 2021-04-24 | Disposition: A | Discharge status: Home / Self Care | Payer: 59 | Attending: Emergency Medicine | Admitting: Emergency Medicine

## 2021-04-24 ENCOUNTER — Other Ambulatory Visit: Payer: Self-pay

## 2021-04-24 ENCOUNTER — Emergency Department: Payer: 59

## 2021-04-24 DIAGNOSIS — R109 Unspecified abdominal pain: Secondary | ICD-10-CM

## 2021-04-24 DIAGNOSIS — F17210 Nicotine dependence, cigarettes, uncomplicated: Secondary | ICD-10-CM | POA: Diagnosis not present

## 2021-04-24 DIAGNOSIS — Z3A12 12 weeks gestation of pregnancy: Secondary | ICD-10-CM | POA: Diagnosis not present

## 2021-04-24 DIAGNOSIS — K5900 Constipation, unspecified: Secondary | ICD-10-CM

## 2021-04-24 DIAGNOSIS — O26891 Other specified pregnancy related conditions, first trimester: Secondary | ICD-10-CM | POA: Diagnosis not present

## 2021-04-24 DIAGNOSIS — N939 Abnormal uterine and vaginal bleeding, unspecified: Secondary | ICD-10-CM

## 2021-04-24 DIAGNOSIS — O200 Threatened abortion: Secondary | ICD-10-CM | POA: Diagnosis not present

## 2021-04-24 DIAGNOSIS — N9489 Other specified conditions associated with female genital organs and menstrual cycle: Secondary | ICD-10-CM | POA: Diagnosis not present

## 2021-04-24 DIAGNOSIS — R110 Nausea: Secondary | ICD-10-CM | POA: Insufficient documentation

## 2021-04-24 DIAGNOSIS — O4690 Antepartum hemorrhage, unspecified, unspecified trimester: Secondary | ICD-10-CM

## 2021-04-24 LAB — URINALYSIS, COMPLETE (UACMP) WITH MICROSCOPIC
Bacteria, UA: NONE SEEN
Bilirubin Urine: NEGATIVE
Glucose, UA: NEGATIVE mg/dL
Hgb urine dipstick: NEGATIVE
Ketones, ur: NEGATIVE mg/dL
Nitrite: NEGATIVE
Protein, ur: NEGATIVE mg/dL
Specific Gravity, Urine: 1.026 (ref 1.005–1.030)
pH: 5 (ref 5.0–8.0)

## 2021-04-24 LAB — CBC
HCT: 36 % (ref 36.0–46.0)
Hemoglobin: 12.1 g/dL (ref 12.0–15.0)
MCH: 31.8 pg (ref 26.0–34.0)
MCHC: 33.6 g/dL (ref 30.0–36.0)
MCV: 94.5 fL (ref 80.0–100.0)
Platelets: 268 10*3/uL (ref 150–400)
RBC: 3.81 MIL/uL — ABNORMAL LOW (ref 3.87–5.11)
RDW: 14.4 % (ref 11.5–15.5)
WBC: 9.4 10*3/uL (ref 4.0–10.5)
nRBC: 0 % (ref 0.0–0.2)

## 2021-04-24 LAB — COMPREHENSIVE METABOLIC PANEL
ALT: 14 U/L (ref 0–44)
AST: 27 U/L (ref 15–41)
Albumin: 3.7 g/dL (ref 3.5–5.0)
Alkaline Phosphatase: 38 U/L (ref 38–126)
Anion gap: 10 (ref 5–15)
BUN: 14 mg/dL (ref 6–20)
CO2: 21 mmol/L — ABNORMAL LOW (ref 22–32)
Calcium: 9.2 mg/dL (ref 8.9–10.3)
Chloride: 107 mmol/L (ref 98–111)
Creatinine, Ser: 0.76 mg/dL (ref 0.44–1.00)
GFR, Estimated: >60 mL/min (ref >60)
Glucose, Bld: 138 mg/dL — ABNORMAL HIGH (ref 70–99)
Potassium: 3.2 mmol/L — ABNORMAL LOW (ref 3.5–5.1)
Sodium: 138 mmol/L (ref 135–145)
Total Bilirubin: 0.6 mg/dL (ref 0.3–1.2)
Total Protein: 7.1 g/dL (ref 6.5–8.1)

## 2021-04-24 LAB — HCG, QUANTITATIVE, PREGNANCY: hCG, Beta Chain, Quant, S: 129269 m[IU]/mL — ABNORMAL HIGH (ref <5)

## 2021-04-24 LAB — LIPASE, BLOOD: Lipase: 27 U/L (ref 11–51)

## 2021-04-24 MED ORDER — ACETAMINOPHEN 500 MG PO TABS
1000.0000 mg | ORAL_TABLET | Freq: Once | ORAL | Status: AC
  Administered 2021-04-24: 1000 mg via ORAL
  Filled 2021-04-24: qty 2

## 2021-04-24 NOTE — ED Notes (Signed)
Pt updated on assessment plan. Pt verbalizes understanding.

## 2021-04-24 NOTE — Discharge Instructions (Addendum)
As we discussed you can try laxatives or stool softeners to help with your constipation (docusate sodium, ducosate calcium, fiber powder). Additionally please start taking prenatal vitamins.  Please seek medical attention for any high fevers, chest pain, shortness of breath, change in behavior, persistent vomiting, bloody stool or any other new or concerning symptoms.

## 2021-04-24 NOTE — ED Notes (Signed)
Pt alert and oriented, verbalized understanding of discharge instructions.

## 2021-04-24 NOTE — ED Notes (Signed)
Secure chat sent to dr. Ellender Hose to see if may order ultrasound for pt and to relay pt request for pain medication. Awaiting response from MD.

## 2021-04-24 NOTE — ED Triage Notes (Signed)
Pt arrives to ER stating she thinks she is miscarrying, believes she is about 7-[redacted] weeks pregnant. Had positive pregnancy test at home. c/o of vaginal bleeding that began Wednesday but has since stopped. C/o abd cramping remains. Also c/o constipation.   A&O, ambulatory.

## 2021-04-24 NOTE — ED Provider Notes (Signed)
[facility] Emergency Department Provider Note   ____________________________________________   I have reviewed the triage vital signs and the nursing notes.   HISTORY  Chief Complaint Abdominal Cramping, Constipation, and Threatened Miscarriage   History limited by: Not Limited   HPI Melanie Murray is a 35 y.o. female who presents to the emergency department today because of concerns for possible miscarriage.  Patient states that for the past couple months she has noticed occasional vaginal spotting.  She states that she thinks she was in denial about possibly being pregnant.  However Wednesday she started having more bleeding.  She also started having some abdominal cramping.  Home pregnancy test was positive.  She has also had some nausea.  This is her fourth pregnancy.  She states she has never had pain or bleeding like this in previous pregnancies.  She has not seen an ob/gyn for this pregnancy.   Records reviewed. Per medical record review patient has a history of being A POS.   History reviewed. No pertinent past medical history.  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 No pertinent surgical history.  Prior to Admission medications   Medication Sig Start Date End Date Taking? Authorizing Provider  hydrOXYzine (ATARAX/VISTARIL) 10 MG tablet Take 1 tablet (10 mg total) by mouth 3 (three) times daily as needed for anxiety. 01/18/21   Salley Scarlet, MD  OLANZapine (ZYPREXA) 10 MG tablet Take 1/2 tablet (5 mg total) by mouth in the morning AND 1&1/2 tablets (15 mg total) at bedtime. 01/18/21   Salley Scarlet, MD    Allergies Patient has no known allergies.  History reviewed. No pertinent family history.  Social History Social History   Tobacco Use   Smoking status: Every Day    Packs/day: 1.00    Types: Cigarettes   Smokeless tobacco:  Never  Substance Use Topics   Alcohol use: Yes    Alcohol/week: 1.0 standard drink    Types: 1 Standard drinks or equivalent per week    Review of Systems Constitutional: No fever/chills Eyes: No visual changes. ENT: No sore throat. Cardiovascular: Denies chest pain. Respiratory: Denies shortness of breath. Gastrointestinal: Positive for abdominal cramping and nausea.  Genitourinary: Positive for vaginal bleeding.  Musculoskeletal: Negative for back pain. Skin: Negative for rash. Neurological: Negative for headaches, focal weakness or numbness.  ____________________________________________   PHYSICAL EXAM:  VITAL SIGNS: ED Triage Vitals  Enc Vitals Group     BP 04/24/21 1623 (!) 128/58     Pulse Rate 04/24/21 1623 75     Resp 04/24/21 1623 16     Temp 04/24/21 1623 98.7 F (37.1 C)     Temp Source 04/24/21 1623 Oral     SpO2 04/24/21 1623 100 %     Weight 04/24/21 1625 180 lb (81.6 kg)     Height 04/24/21 1625 '5\' 5"'$  (1.651 m)     Head Circumference --      Peak Flow --      Pain Score 04/24/21 1625 10   Constitutional: Alert and oriented.  Eyes: Conjunctivae are normal.  ENT      Head: Normocephalic and atraumatic.      Nose: No congestion/rhinnorhea.      Mouth/Throat: Mucous membranes are moist.      Neck: No stridor. Hematological/Lymphatic/Immunilogical: No cervical lymphadenopathy. Cardiovascular: Normal rate, regular rhythm.  No murmurs, rubs, or gallops.  Respiratory: Normal respiratory effort without tachypnea nor retractions. Breath sounds are clear and equal bilaterally. No wheezes/rales/rhonchi. Gastrointestinal: Soft and non tender. No rebound. No guarding.  Genitourinary: Deferred Musculoskeletal: Normal range of motion in all extremities. No lower extremity edema. Neurologic:  Normal speech and language. No gross focal neurologic deficits are appreciated.  Skin:  Skin is warm, dry and intact. No rash noted. Psychiatric: Mood and affect are normal.  Speech and behavior are normal. Patient exhibits appropriate insight and judgment.  ____________________________________________    LABS (pertinent positives/negatives)  CBC wbc 9.4, hgb 12.1, plt 268 hCG [id number] Lipase 27 CMP wnl except k 3.2, co2 21, glu 138 UA hazy, small leukocytes, 0-5 rbc and wbc ____________________________________________   EKG  None  ____________________________________________    RADIOLOGY  US OB Single live intrauterine pregnancy at 12 weeks. Large uterine fibroid.  ____________________________________________   PROCEDURES  Procedures  ____________________________________________   INITIAL IMPRESSION / ASSESSMENT AND PLAN / ED COURSE  Pertinent labs & imaging results that were available during my care of the patient were reviewed by me and considered in my medical decision making (see chart for details).   Patient presented to the emergency department today because of concerns for possible miscarriage.  Ultrasound here does show a single live intrauterine pregnancy.  Also does show a large fibroid.  I discussed this finding with the patient.  Do wonder the fibroid might of been contributing to the bleeding.  Patient is a positive per chart review and she denies ever receiving RhoGAM.  Did discuss with the patient starting prenatal vitamins.  She is also complaining of some constipation which might explain some of the abdominal cramping.  Did discuss trying some stool softeners and/or laxatives as well as fiber.  Will give patient OB/GYN follow-up.  ____________________________________________   FINAL CLINICAL IMPRESSION(S) / ED DIAGNOSES  Final diagnoses:  Abdominal pain during pregnancy in first trimester  Vaginal bleeding in pregnancy  Constipation, unspecified constipation type     Note: This dictation was prepared with Dragon dictation. Any transcriptional errors that result from this process are unintentional     Nance Pear, MD 04/24/21 2231

## 2021-05-08 ENCOUNTER — Emergency Department: Payer: 59

## 2021-05-08 ENCOUNTER — Other Ambulatory Visit: Payer: Self-pay

## 2021-05-08 ENCOUNTER — Emergency Department
Admission: EM | Admit: 2021-05-08 | Discharge: 2021-05-08 | Disposition: A | Discharge status: Home / Self Care | Payer: 59 | Attending: Emergency Medicine | Admitting: Emergency Medicine

## 2021-05-08 ENCOUNTER — Encounter: Payer: Self-pay | Admitting: Emergency Medicine

## 2021-05-08 DIAGNOSIS — R1031 Right lower quadrant pain: Secondary | ICD-10-CM | POA: Diagnosis present

## 2021-05-08 DIAGNOSIS — K5909 Other constipation: Secondary | ICD-10-CM | POA: Diagnosis not present

## 2021-05-08 DIAGNOSIS — F17210 Nicotine dependence, cigarettes, uncomplicated: Secondary | ICD-10-CM | POA: Insufficient documentation

## 2021-05-08 DIAGNOSIS — N9489 Other specified conditions associated with female genital organs and menstrual cycle: Secondary | ICD-10-CM | POA: Diagnosis not present

## 2021-05-08 DIAGNOSIS — D259 Leiomyoma of uterus, unspecified: Secondary | ICD-10-CM | POA: Diagnosis not present

## 2021-05-08 LAB — CBC WITH DIFFERENTIAL/PLATELET
Abs Immature Granulocytes: 0.04 10*3/uL (ref 0.00–0.07)
Basophils Absolute: 0.1 10*3/uL (ref 0.0–0.1)
Basophils Relative: 0 %
Eosinophils Absolute: 0.3 10*3/uL (ref 0.0–0.5)
Eosinophils Relative: 2 %
HCT: 37.2 % (ref 36.0–46.0)
Hemoglobin: 12.4 g/dL (ref 12.0–15.0)
Immature Granulocytes: 0 %
Lymphocytes Relative: 17 %
Lymphs Abs: 2.3 10*3/uL (ref 0.7–4.0)
MCH: 31.7 pg (ref 26.0–34.0)
MCHC: 33.3 g/dL (ref 30.0–36.0)
MCV: 95.1 fL (ref 80.0–100.0)
Monocytes Absolute: 0.8 10*3/uL (ref 0.1–1.0)
Monocytes Relative: 6 %
Neutro Abs: 10.4 10*3/uL — ABNORMAL HIGH (ref 1.7–7.7)
Neutrophils Relative %: 75 %
Platelets: 252 10*3/uL (ref 150–400)
RBC: 3.91 MIL/uL (ref 3.87–5.11)
RDW: 14 % (ref 11.5–15.5)
WBC: 14 10*3/uL — ABNORMAL HIGH (ref 4.0–10.5)
nRBC: 0 % (ref 0.0–0.2)

## 2021-05-08 LAB — POC URINE PREG, ED: Preg Test, Ur: POSITIVE — AB

## 2021-05-08 LAB — COMPREHENSIVE METABOLIC PANEL
ALT: 11 U/L (ref 0–44)
AST: 17 U/L (ref 15–41)
Albumin: 3.1 g/dL — ABNORMAL LOW (ref 3.5–5.0)
Alkaline Phosphatase: 48 U/L (ref 38–126)
Anion gap: 4 — ABNORMAL LOW (ref 5–15)
BUN: 12 mg/dL (ref 6–20)
CO2: 26 mmol/L (ref 22–32)
Calcium: 8.9 mg/dL (ref 8.9–10.3)
Chloride: 107 mmol/L (ref 98–111)
Creatinine, Ser: 0.71 mg/dL (ref 0.44–1.00)
GFR, Estimated: >60 mL/min (ref >60)
Glucose, Bld: 96 mg/dL (ref 70–99)
Potassium: 4 mmol/L (ref 3.5–5.1)
Sodium: 137 mmol/L (ref 135–145)
Total Bilirubin: 0.3 mg/dL (ref 0.3–1.2)
Total Protein: 7.4 g/dL (ref 6.5–8.1)

## 2021-05-08 LAB — HCG, QUANTITATIVE, PREGNANCY: hCG, Beta Chain, Quant, S: 4035 m[IU]/mL — ABNORMAL HIGH (ref <5)

## 2021-05-08 MED ORDER — KETOROLAC TROMETHAMINE 10 MG PO TABS
10.0000 mg | ORAL_TABLET | Freq: Three times a day (TID) | ORAL | 0 refills | Status: AC | PRN
  Filled 2021-05-08: qty 15, 5d supply, fill #0

## 2021-05-08 MED ORDER — KETOROLAC TROMETHAMINE 30 MG/ML IJ SOLN
30.0000 mg | Freq: Once | INTRAMUSCULAR | Status: AC
  Administered 2021-05-08: 30 mg via INTRAVENOUS
  Filled 2021-05-08: qty 1

## 2021-05-08 MED ORDER — IOHEXOL 350 MG/ML SOLN
75.0000 mL | Freq: Once | INTRAVENOUS | Status: AC | PRN
  Administered 2021-05-08: 75 mL via INTRAVENOUS
  Filled 2021-05-08: qty 75

## 2021-05-08 NOTE — ED Provider Notes (Signed)
[facility] Emergency Department Provider Note ____________________________________________  Time seen: 1900  I have reviewed the triage vital signs and the nursing notes.  HISTORY  Chief Complaint  Constipation   HPI Melanie Murray is a 35 y.o. female presents to the ER today with complaint of abdominal cramping, bloating and constipation.  She reports this started 2 weeks ago.  She reports most of her pain is in her right lower quadrant.  She describes the pain as cramps that radiates into her her back.  She reports the pain is temporarily relieved by having a bowel movement however she has been unable to have a bowel movement without taking a Dulcolax.  She had 1 episode of nausea and vomiting 1 week ago but this has resolved.  She denies recent changes in diet or medications.  She has never had issues with constipation before.  She denies urinary or vaginal issues.  She has no family history of colon cancer that she is aware of.   History reviewed. No pertinent past medical history.  Patient Active Problem List   Diagnosis Date Noted   Brief psychotic disorder ([place]) 01/13/2021   Cannabis abuse 01/13/2021    History reviewed. No pertinent surgical history.  Prior to Admission medications   Medication Sig Start Date End Date Taking? Authorizing Provider  ketorolac (TORADOL) 10 MG tablet Take 1 tablet (10 mg total) by mouth every 8 (eight) hours as needed. 05/08/21  Yes Jearld Fenton, NP  hydrOXYzine (ATARAX/VISTARIL) 10 MG tablet Take 1 tablet (10 mg total) by mouth 3 (three) times daily as needed for anxiety. 01/18/21   Salley Scarlet, MD  OLANZapine (ZYPREXA) 10 MG tablet Take 1/2 tablet (5 mg total) by mouth in the morning AND 1&1/2 tablets (15 mg total) at bedtime. 01/18/21   Salley Scarlet, MD    Allergies Patient has no known allergies.  No family history on file.  Social History Social History   Tobacco Use   Smoking status: Every Day     Packs/day: 1.00    Types: Cigarettes   Smokeless tobacco: Never  Substance Use Topics   Alcohol use: Yes    Alcohol/week: 1.0 standard drink    Types: 1 Standard drinks or equivalent per week    Review of Systems  Constitutional: Negative for fever, chills or body aches. Cardiovascular: Negative for chest pain or chest tightness. Respiratory: Negative for cough or shortness of breath. Gastrointestinal: Positive for abdominal cramping and diarrhea.  Negative for nausea, vomiting, diarrhea or blood in her stool. Genitourinary: Negative for  urinary urgency, frequency, dysuria, blood in her urine, or vaginal complaints. Musculoskeletal: Positive for right low back pain. Skin: Negative for rash. Neurological: Negative for headaches,, dizziness, focal weakness, tingling or numbness. ____________________________________________  PHYSICAL EXAM:  VITAL SIGNS: ED Triage Vitals  Enc Vitals Group     BP 05/08/21 1728 116/77     Pulse Rate 05/08/21 1728 99     Resp 05/08/21 1728 16     Temp 05/08/21 1728 99.2 F (37.3 C)     Temp Source 05/08/21 1728 Oral     SpO2 05/08/21 1728 99 %     Weight 05/08/21 1727 178 lb 9.2 oz (81 kg)     Height 05/08/21 1727 '5\' 5"'$  (1.651 m)     Head Circumference --      Peak Flow --      Pain Score 05/08/21 1727 10     Pain Loc --  Pain Edu? --      Excl. in [place]? --     Constitutional: Alert and oriented.  Appears uncomfortable but in no distress. Head: Normocephalic. Cardiovascular: Normal rate, regular rhythm.  Respiratory: Normal respiratory effort. No wheezes/rales/rhonchi. Gastrointestinal: Soft and mildly tender in the RLQ.  Hypoactive bowel sounds.  No rebound tenderness.  No pain with palpation McBurney's point.  No distention. Musculoskeletal: No difficulty with gait. Neurologic:   Normal speech and language. No gross focal neurologic deficits are appreciated. Skin:  Skin is warm, dry and intact. No rash  noted. ____________________________________________   RADIOLOGY Imaging Orders  DG Abdomen 1 View  CT ABDOMEN PELVIS W CONTRAST   IMPRESSION: Negative.  IMPRESSION: Large uterine fibroid as described.  No other focal abnormality is seen.  ____________________________________________ LABS Labs Reviewed  COMPREHENSIVE METABOLIC PANEL - Abnormal; Notable for the following components:      Result Value   Albumin 3.1 (*)    Anion gap 4 (*)    All other components within normal limits  CBC WITH DIFFERENTIAL/PLATELET - Abnormal; Notable for the following components:   WBC 14.0 (*)    Neutro Abs 10.4 (*)    All other components within normal limits  HCG, QUANTITATIVE, PREGNANCY - Abnormal; Notable for the following components:   hCG, Beta Chain, Quant, S 4,035 (*)    All other components within normal limits  POC URINE PREG, ED - Abnormal; Notable for the following components:   Preg Test, Ur Positive (*)    All other components within normal limits    ____________________________________________   INITIAL IMPRESSION / ASSESSMENT AND PLAN / ED COURSE  Abdominal Cramping, Right Sided Back Pain, Constipation:  DDx include constipation, colitis, uterine puncture,  KUB shows mild stool in the colon Urine pregnancy test positive (she advised me there was no possibility and failed to mention during my initial exam that she had an elective abortion 3 days ago. Toradol 30 mg IM x 1 CBC shows and elevated white count of 14.1, but likely reactive given her recent procedure, no systemic s/s of infection CMET unremarkable Serum HcG 4035 CT abd/pelvis shows large uterine fibroid, otherwise no acute abnormality Discussed the findings with pt RX for Mirilax 17 mg PO daily RX for Ketorolac 10 mg TID prn- consume with food Stop Dulcolax as this is likely contributing to your abdomina cramping Will have her follow up with PCP and GYN as an  outpatient ____________________________________________  FINAL CLINICAL IMPRESSION(S) / ED DIAGNOSES  Final diagnoses:  Other constipation  Uterine leiomyoma, unspecified location      Jearld Fenton, NP 05/08/21 2239    Delman Kitten, MD 05/18/21 1925

## 2021-05-08 NOTE — Discharge Instructions (Addendum)
I have started you on Mirilax 17 gm daily in place of Dulcolax. Consume a high fiber diet and at least 64 oz of water daily. Please follow up with the [facility] for further evaluation. You have a uterine fibroid. Please follow up with GYN for further evaluation.

## 2021-05-08 NOTE — ED Triage Notes (Signed)
Pt to ED via POV c/o constipation and abdominal cramping. Pt states that she has been having bowel movements but she has been having to use OTC laxatives. Pt states that she is not able to have a bowel movement by herself. Pt is currently in NAD.

## 2021-05-08 NOTE — ED Notes (Signed)
Pt to ED POV c/o constipation that started 2 weeks ago. Pt also has abdominal cramping in RLQ and back pain. Pt states she has tried OTC laxative to relieve constipation and tylenol to relieve pain. Last BM was yesterday (8/6). Denies NVD.  A&O X4, NAD at this time.

## 2021-05-08 NOTE — ED Notes (Signed)
Pt given urine cup, ambulatory to restroom to collect specimen. States "I just had an abortion on the 4th so it is going to read positive but I'm not pregnant".

## 2021-05-08 NOTE — ED Notes (Signed)
Pt finished with PO contrast, labs sent.

## 2021-05-09 ENCOUNTER — Other Ambulatory Visit: Payer: Self-pay

## 2023-01-03 IMAGING — CT CT ABD-PELV W/ CM
2 of 5 series · 16 of 46 positions shown, 18 images · IV contrast (APPLIED)
Comparison: None.

CLINICAL DATA: History of recent miscarriage, abdominal pain

EXAM:
CT ABDOMEN AND PELVIS WITH CONTRAST
TECHNIQUE: Multidetector CT imaging of the abdomen and pelvis was performed
using the standard protocol following bolus administration of
intravenous contrast.
CONTRAST:  75mL OMNIPAQUE IOHEXOL 350 MG/ML SOLN

[Series 2: routine abd/pel with · axial · 0.95mm/px · z∈[-1006,-596]mm · 13 of 92 slices shown, 15 images]
[im 5/92  soft-tissue]
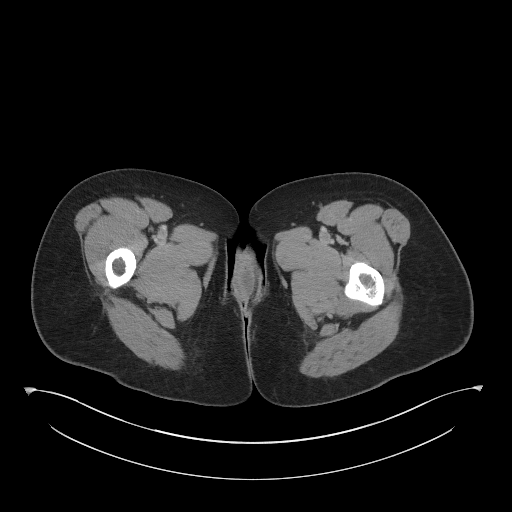
[im 5/92  bone]
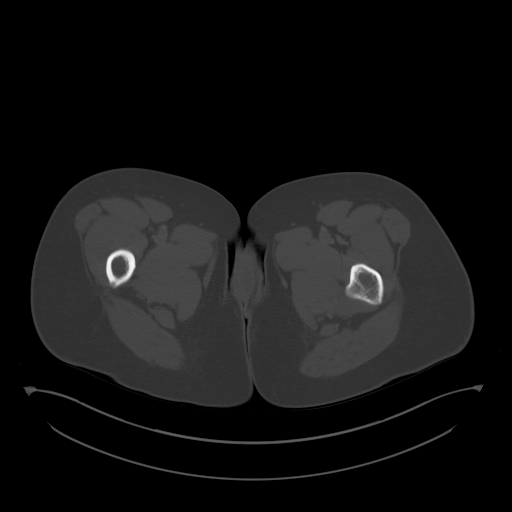
[im 15/92  soft-tissue]
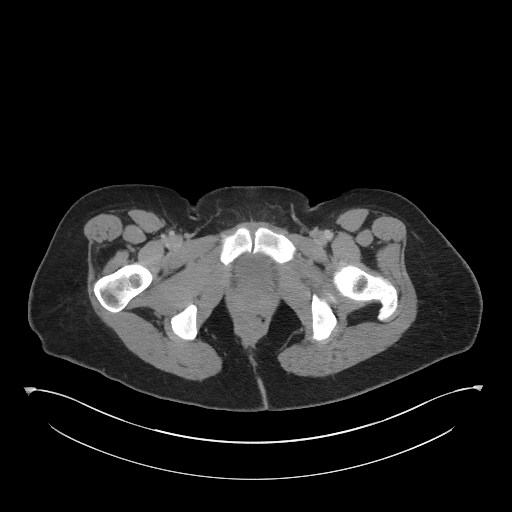
[im 20/92  soft-tissue]
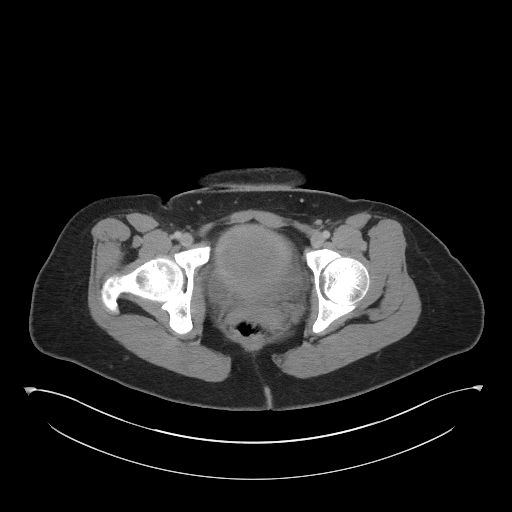
[im 24/92  soft-tissue]
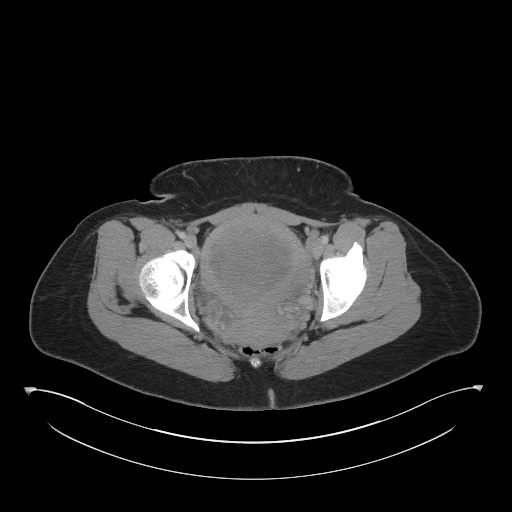
[im 34/92  soft-tissue]
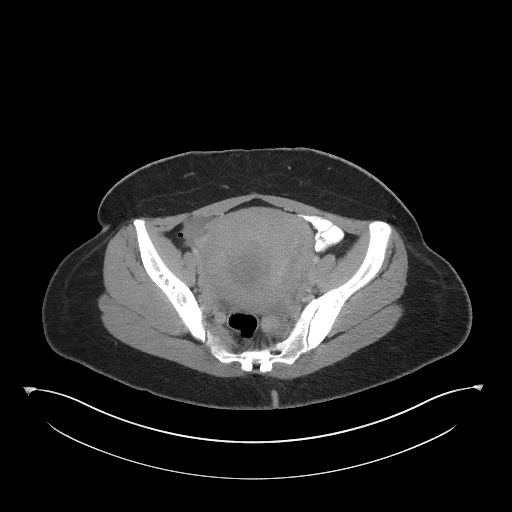
[im 39/92  soft-tissue]
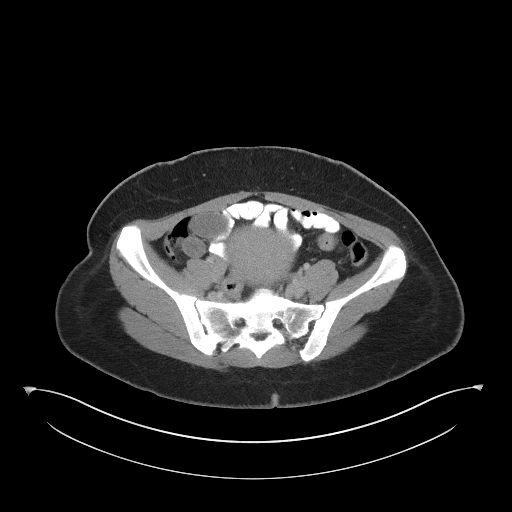
[im 48/92  soft-tissue]
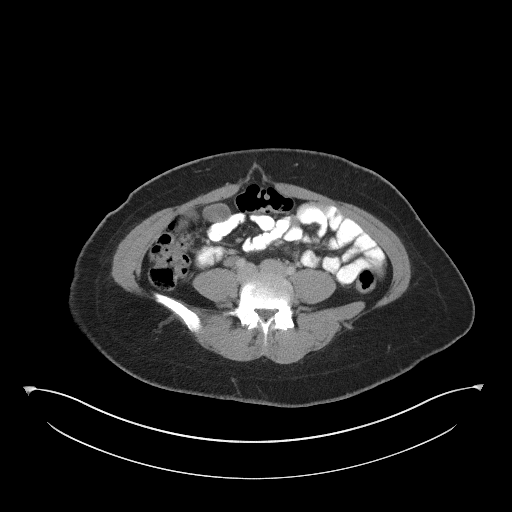
[im 53/92  soft-tissue]
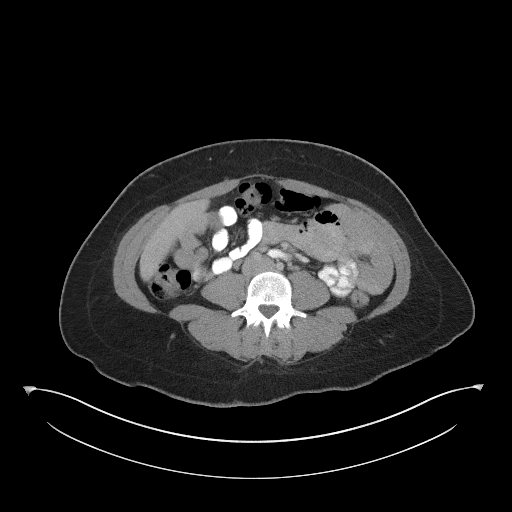
[im 58/92  soft-tissue]
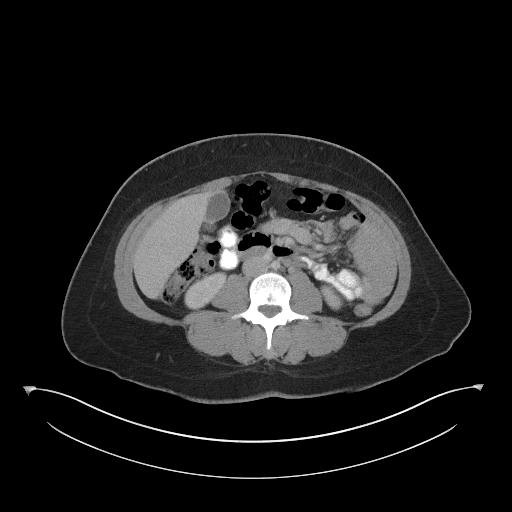
[im 58/92  bone]
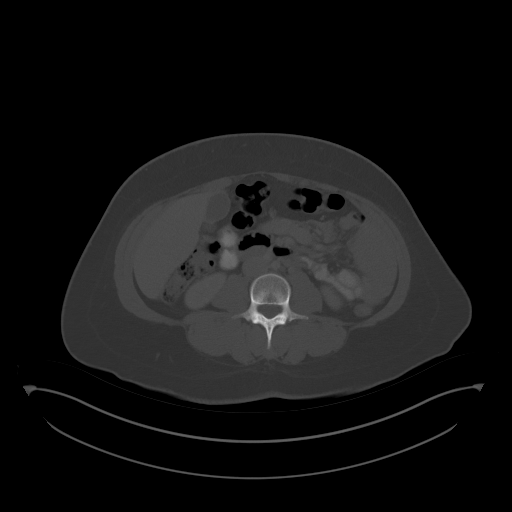
[im 68/92  soft-tissue]
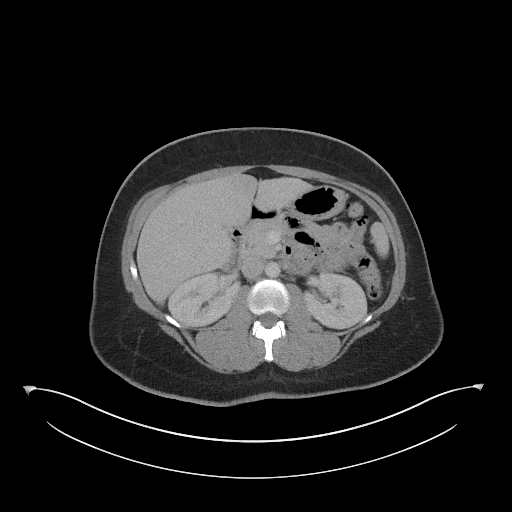
[im 72/92  soft-tissue]
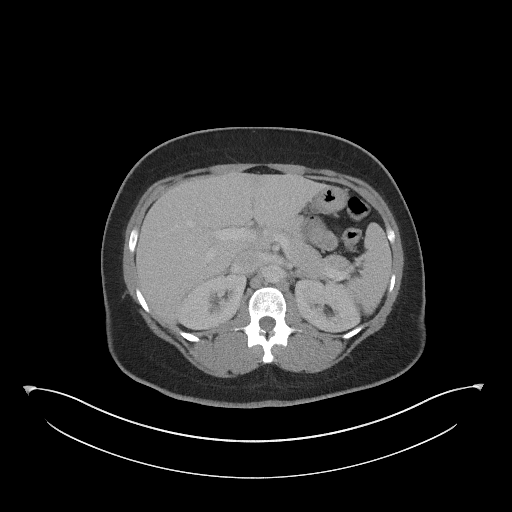
[im 77/92  soft-tissue]
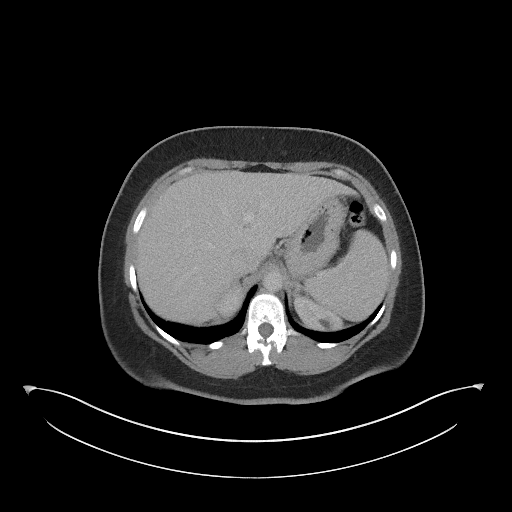
[im 87/92  soft-tissue]
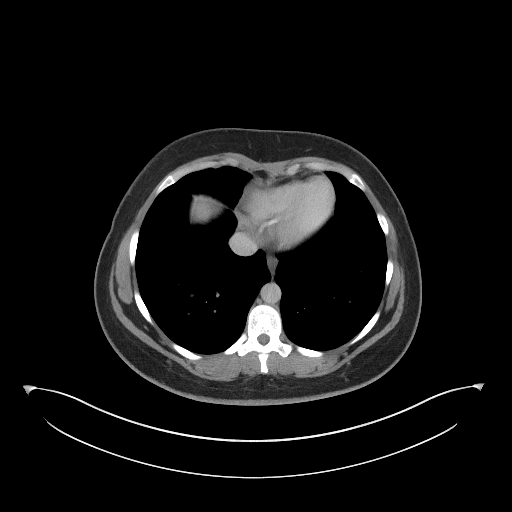

[Series 5: coronal st · coronal · 0.85mm/px · 3 of 91 slices shown]
[im 31/91  soft-tissue]
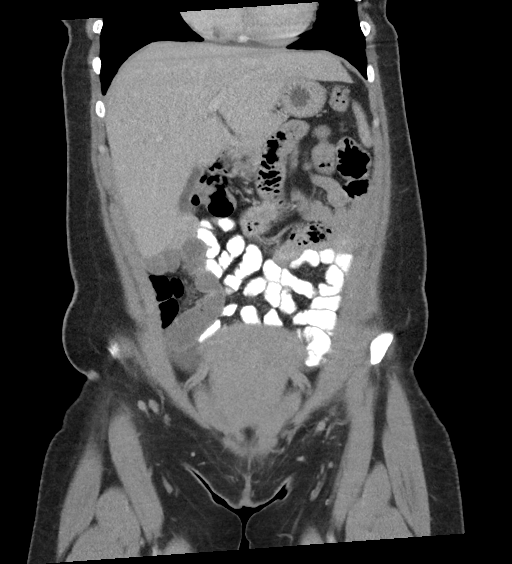
[im 41/91  soft-tissue]
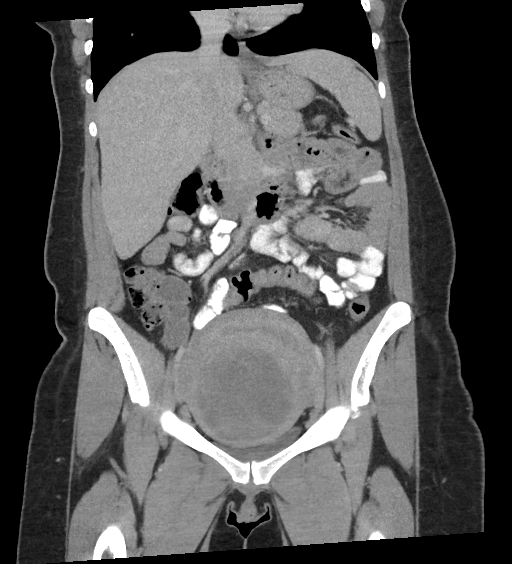
[im 51/91  soft-tissue]
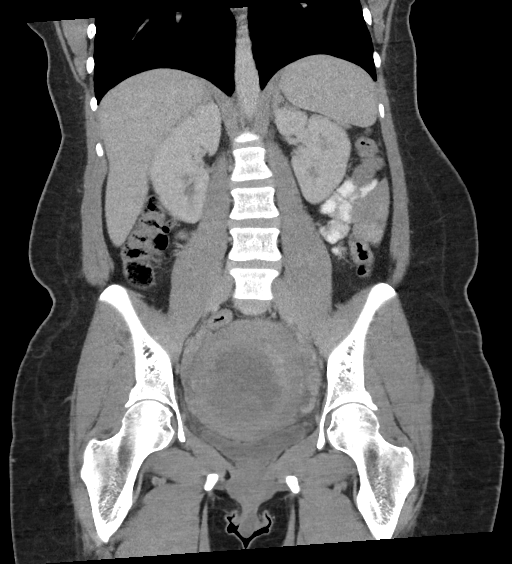

[16 of 46 positions shown; findings below may reference images not displayed]

FINDINGS: Lower chest: No acute abnormality.

Hepatobiliary: No focal liver abnormality is seen. No gallstones,
gallbladder wall thickening, or biliary dilatation.

Pancreas: Unremarkable. No pancreatic ductal dilatation or
surrounding inflammatory changes.

Spleen: Normal in size without focal abnormality.

Adrenals/Urinary Tract: Adrenal glands are within normal limits.
Kidneys are well visualized bilaterally. Small left upper pole renal
cyst is seen. Normal excretion is noted. No renal calculi or
obstructive changes are seen. The bladder is decompressed.

Stomach/Bowel: No obstructive or inflammatory changes of the colon
are seen. No significant retained fecal material is noted. The
appendix is within normal limits. Small bowel and stomach are
unremarkable.

Vascular/Lymphatic: No significant vascular findings are present. No
enlarged abdominal or pelvic lymph nodes.

Reproductive: Uterus is enlarged secondary to a large 9.3 x 9.1 cm
anterior uterine fibroid. It displaces the uterus posteriorly.
Endometrial canal appears within normal limits. No adnexal
abnormality is noted.

Other: No abdominal wall hernia or abnormality. No abdominopelvic
ascites.

Musculoskeletal: No acute or significant osseous findings.
IMPRESSION: Large uterine fibroid as described.

No other focal abnormality is seen.
